# Patient Record
Sex: Female | Born: 1950 | Race: White | Hispanic: No | Marital: Married | Smoking: Never smoker
Health system: Southern US, Academic
[De-identification: ages and names within clinical notes are randomized; demographics above are authoritative.]

## PROBLEM LIST (undated history)

## (undated) DIAGNOSIS — R51 Headache: Secondary | ICD-10-CM

## (undated) DIAGNOSIS — E079 Disorder of thyroid, unspecified: Secondary | ICD-10-CM

## (undated) DIAGNOSIS — IMO0001 Reserved for inherently not codable concepts without codable children: Secondary | ICD-10-CM

## (undated) DIAGNOSIS — N3281 Overactive bladder: Secondary | ICD-10-CM

## (undated) DIAGNOSIS — Z6841 Body Mass Index (BMI) 40.0 and over, adult: Secondary | ICD-10-CM

## (undated) DIAGNOSIS — Z9889 Other specified postprocedural states: Secondary | ICD-10-CM

## (undated) DIAGNOSIS — R519 Headache, unspecified: Secondary | ICD-10-CM

## (undated) DIAGNOSIS — K449 Diaphragmatic hernia without obstruction or gangrene: Secondary | ICD-10-CM

## (undated) DIAGNOSIS — M1990 Unspecified osteoarthritis, unspecified site: Secondary | ICD-10-CM

## (undated) DIAGNOSIS — E039 Hypothyroidism, unspecified: Secondary | ICD-10-CM

## (undated) HISTORY — PX: HX KNEE SURGERY: 2100001320

## (undated) HISTORY — PX: HX KNEE REPLACMENT: SHX125

## (undated) HISTORY — PX: HX GASTRIC BYPASS: SHX52

## (undated) HISTORY — PX: HX GALL BLADDER SURGERY/CHOLE: SHX55

## (undated) HISTORY — DX: Overactive bladder: N32.81

## (undated) HISTORY — DX: Disorder of thyroid, unspecified: E07.9

## (undated) HISTORY — DX: Other specified postprocedural states: Z98.890

## (undated) HISTORY — DX: Body Mass Index (BMI) 40.0 and over, adult: Z684

## (undated) HISTORY — DX: Hypothyroidism, unspecified: E03.9

## (undated) HISTORY — DX: Morbid (severe) obesity due to excess calories: E66.01

## (undated) HISTORY — PX: TOTAL HIP ARTHROPLASTY: SHX124

## (undated) HISTORY — DX: Headache, unspecified: R51.9

## (undated) HISTORY — PX: KNEE CARTILAGE SURGERY: SHX688

## (undated) HISTORY — DX: Diaphragmatic hernia without obstruction or gangrene: K44.9

## (undated) HISTORY — DX: Unspecified osteoarthritis, unspecified site: M19.90

---

## 1997-10-05 ENCOUNTER — Ambulatory Visit
Admit: 1997-10-05 | Disposition: A | Discharge status: Home / Self Care | Payer: Self-pay | Admitting: Obstetrics & Gynecology

## 1997-10-20 ENCOUNTER — Ambulatory Visit
Admit: 1997-10-20 | Disposition: A | Discharge status: Home / Self Care | Payer: Self-pay | Admitting: Obstetrics & Gynecology

## 1999-05-08 ENCOUNTER — Ambulatory Visit: Admit: 1999-05-08 | Disposition: A | Discharge status: Home / Self Care | Payer: Self-pay | Source: Ambulatory Visit

## 1999-05-13 ENCOUNTER — Other Ambulatory Visit: Payer: Self-pay

## 1999-05-13 ENCOUNTER — Ambulatory Visit
Admit: 1999-05-13 | Disposition: A | Discharge status: Home / Self Care | Payer: Self-pay | Admitting: Orthopaedic Surgery

## 2001-02-09 ENCOUNTER — Ambulatory Visit
Admit: 2001-02-09 | Disposition: A | Discharge status: Home / Self Care | Payer: Self-pay | Admitting: Orthopaedic Surgery

## 2001-04-14 ENCOUNTER — Ambulatory Visit
Admission: RE | Admit: 2001-04-14 | Disposition: A | Discharge status: Home / Self Care | Payer: Self-pay | Source: Ambulatory Visit

## 2001-05-21 ENCOUNTER — Ambulatory Visit: Admit: 2001-05-21 | Disposition: A | Discharge status: Home / Self Care | Payer: Self-pay | Source: Ambulatory Visit

## 2001-05-24 ENCOUNTER — Ambulatory Visit: Payer: Self-pay

## 2003-05-17 ENCOUNTER — Ambulatory Visit: Payer: Self-pay

## 2003-06-15 ENCOUNTER — Ambulatory Visit: Payer: Self-pay

## 2003-06-29 ENCOUNTER — Ambulatory Visit
Admission: RE | Admit: 2003-06-29 | Disposition: A | Discharge status: Home / Self Care | Payer: Self-pay | Source: Ambulatory Visit

## 2003-07-27 ENCOUNTER — Ambulatory Visit: Payer: Self-pay

## 2004-04-02 ENCOUNTER — Ambulatory Visit: Payer: Self-pay

## 2005-01-21 ENCOUNTER — Ambulatory Visit
Admission: RE | Admit: 2005-01-21 | Disposition: A | Discharge status: Home / Self Care | Payer: Self-pay | Source: Ambulatory Visit | Admitting: Orthopaedic Surgery

## 2005-03-31 HISTORY — PX: LAPAROSCOPIC GASTRIC BANDING: SHX1100

## 2005-06-06 ENCOUNTER — Ambulatory Visit: Payer: Self-pay

## 2005-06-10 ENCOUNTER — Ambulatory Visit: Payer: Self-pay

## 2005-07-25 ENCOUNTER — Ambulatory Visit: Admit: 2005-07-25 | Disposition: A | Discharge status: Home / Self Care | Payer: Self-pay | Source: Ambulatory Visit

## 2005-09-18 ENCOUNTER — Ambulatory Visit
Admission: RE | Admit: 2005-09-18 | Disposition: A | Discharge status: Home / Self Care | Payer: Self-pay | Source: Ambulatory Visit

## 2005-10-24 ENCOUNTER — Ambulatory Visit: Payer: Self-pay

## 2006-01-08 ENCOUNTER — Ambulatory Visit: Payer: Self-pay

## 2006-01-13 ENCOUNTER — Ambulatory Visit: Payer: Self-pay

## 2006-03-31 HISTORY — PX: CHOLECYSTECTOMY: SHX55

## 2006-05-15 ENCOUNTER — Emergency Department: Payer: Self-pay

## 2006-05-19 ENCOUNTER — Ambulatory Visit: Payer: Self-pay

## 2006-05-22 ENCOUNTER — Ambulatory Visit: Payer: Self-pay

## 2006-07-15 ENCOUNTER — Ambulatory Visit: Payer: Self-pay

## 2006-12-19 ENCOUNTER — Ambulatory Visit: Payer: Self-pay

## 2007-11-30 ENCOUNTER — Ambulatory Visit: Payer: Self-pay

## 2009-03-07 ENCOUNTER — Ambulatory Visit: Admit: 2009-03-07 | Payer: Self-pay | Source: Ambulatory Visit

## 2009-04-20 ENCOUNTER — Ambulatory Visit: Admission: RE | Admit: 2009-04-20 | Payer: Self-pay | Source: Ambulatory Visit

## 2010-03-31 HISTORY — PX: JOINT REPLACEMENT: SHX530

## 2010-06-07 ENCOUNTER — Ambulatory Visit: Admit: 2010-06-07 | Disposition: A | Discharge status: Home / Self Care | Payer: Self-pay | Source: Ambulatory Visit

## 2010-08-06 ENCOUNTER — Encounter (INDEPENDENT_AMBULATORY_CARE_PROVIDER_SITE_OTHER): Payer: Self-pay | Admitting: Family Medicine

## 2010-08-06 ENCOUNTER — Ambulatory Visit (INDEPENDENT_AMBULATORY_CARE_PROVIDER_SITE_OTHER): Admitting: Family Medicine

## 2010-08-06 NOTE — Progress Notes (Signed)
 Brittany Bailey  Date of Service: 08/06/2010    Chief complaint: Chief Complaint   Patient presents with   . New Patient     to estab       Subjective  Pt comes to clinic today to establish care. Formerly seen by Dr. Cleotilde. Feels well today.   Pt is going to have left knee replacement in June 18th, 2012, her physician Dr. Jalaine would like for the pt to have preoperative blood work, EKG and evaluation one month prior to her proposed surgery.     ROS: +headache,   PMHx: Migraines, seasonal allergies  Surg Hx: cholecystectomy 2010, lab band 2008, right knee sx x 2  FamHx: Mother: T2DM   Social HX: never smoker, no etoh, no drug use, lives with husband  Allergies: PCN and Sulfa->hives     LMP: 1998  Last PAP: 2011 (due in September) never abnormal  Last Mammogram: 2012 (last month) never abnormal  GYN: Dr. Jenkins Bailey [place]  Orthopedic: Dr. Geralene Dasen    There is no problem list on file for this patient.      No outpatient prescriptions prior to encounter    Objective  Vitals: BP 124/80  Pulse 84  Temp(Src) 36.9 C (98.5 F) (Oral)  Resp 16  Ht 1.684 m (5' 6.3)  Wt 104.418 kg (230 lb 3.2 oz)  BMI 36.82 kg/m2  General: appears in good health, morbidly obese, appears younger than stated age and no distress  Eyes: Conjunctiva clear., Pupils equal and round, reactive to light and accomodation.   HENT:ENT without erythema or injection, mucous membranes moist., TM's Clear.   Neck: supple, symmetrical, trachea midline and no adenopathy  Lungs: clear to auscultation bilaterally.   Cardiovascular:    Heart regular rate and rhythm without murmer  Abdomen: soft, non-tender, bowel sounds normal and distended  Extremities: No edema and bilateral knees with crepitus, no effusions or erythema noted.   Skin: Skin warm and dry  Neurologic: normal mental status, sensory, motor, cranial nerves, reflexes, coordination, and gait  Psychiatric: AOx3 and normal affect, behavior, memory, thought content, judgement, and  speech.      Assessment/Plan  1. PE (physical exam), annual (V70.0Y) - discussed healthy diet and exercise, pt reports that she has been trying to eat low sugar, carb and low fat foods, she is active with [employer].    2. Preop examination (V72.84B) -preordered requested lab work, we will have the pt return to clinic for her evaluation and EKG. We will compile the requested preoperative testing and evaluation and fax to Dr. Jalaine when complete.      RTC for preoperative evaluation    Orders Placed This Encounter   . EAST ECG (AMB ONLY)   . CBC   . COMPREHENSIVE METABOLIC PROFILE - [facility] ONLY   . PROTHROMBIN TIME/INR SCREENING   . URINE ROUTINE W/REFLEX - [facility] ONLY   . TYPE AND SCREEN       Alm Shove Neville GAILS, MD

## 2010-08-22 ENCOUNTER — Telehealth (INDEPENDENT_AMBULATORY_CARE_PROVIDER_SITE_OTHER): Payer: Self-pay | Admitting: Family Medicine

## 2010-08-22 NOTE — Telephone Encounter (Signed)
Pt called and said that she did not get her script for synthroid that she used to get from Dr Hyacinth Meeker. Does she need to have an appointment for that or will you just call it in for her at [facility]. Her surgery date has been cancelled and will be rescheduled at a later date.

## 2010-08-24 MED ORDER — LEVOTHYROXINE 50 MCG TABLET
50.0000 ug | ORAL_TABLET | Freq: Every day | ORAL | Status: DC
Start: 2010-08-24 — End: 2013-03-30

## 2010-08-24 NOTE — Telephone Encounter (Signed)
[facility].  Lew Dawes, MD

## 2010-08-27 ENCOUNTER — Encounter (INDEPENDENT_AMBULATORY_CARE_PROVIDER_SITE_OTHER): Admitting: Family Medicine

## 2010-09-02 ENCOUNTER — Ambulatory Visit (INDEPENDENT_AMBULATORY_CARE_PROVIDER_SITE_OTHER): Admitting: UHP MOB PRIMARY CARE

## 2012-08-19 ENCOUNTER — Encounter (INDEPENDENT_AMBULATORY_CARE_PROVIDER_SITE_OTHER): Payer: Self-pay | Admitting: Family Medicine

## 2012-08-19 ENCOUNTER — Ambulatory Visit (INDEPENDENT_AMBULATORY_CARE_PROVIDER_SITE_OTHER): Admitting: Family Medicine

## 2012-08-19 VITALS — BP 130/82 | HR 84 | Temp 98.4°F | Resp 16 | Wt 285.7 lb

## 2012-08-19 MED ORDER — CEFDINIR 300 MG CAPSULE
300.00 mg | ORAL_CAPSULE | Freq: Two times a day (BID) | ORAL | Status: AC
Start: 2012-08-19 — End: 2012-08-29

## 2012-08-19 NOTE — Patient Instructions (Addendum)
[facility]  [address]  [place], [place] [address]  Phone: [phone]  Fax: [phone]             Open Daily 8:00am - 8:00pm, except Sundays 12pm-8pm         ~ Closed Thanksgiving and Christmas Day     Attending Caregiver: Talmage Nap, MD      Today's orders:   Orders Placed This Encounter   . cefdinir (OMNICEF) 300 mg Oral Capsule        Prescription(s) E-Rx to:  [id number] [place],  - [address]    ________________________________________________________________________  Short Term Disability and Family Medical Leave Act  [facility] does NOT provide assistance with any disability applications.  If you feel your medical condition requires you to be on disability, you will need to follow up with  Your primary care physician or a specialist.  We apologize for any inconvenience.    For Medication Prescribed by [facility]:  As an Urgent Care facility, our clinic does NOT offer prescription refills over the telephone.    If you need more of the medication one of our medical providers prescribed, you will  Either need to be re-evaluated by [place] or see your primary care physician.    ________________________________________________________________________      It is very important that we have a phone number that is the single best way to contact you in the event that we become aware of important clinical information or concerns after your discharge.  If the phone number you provided at registration is NOT this number you should inform staff and registration prior to leaving.       Your treatment and evaluation today was focused on identifying and treating potentially emergent conditions based on your presenting signs, symptoms, and history.  The resulting initial clinical impression and treatment plan is not intended to be definitive or a substitute for a full physical examination and evaluation by your primary care provider.  If your symptoms persist, worsen, or you develop any new or concerning symptoms, you need to be evaluated.      If you received x-rays during your visit, be aware that the final and formal interpretation of those films by a radiologist may occur after your discharge.  If there is a significant discrepancy identified after your discharge, we will contact you at the telephone number provided at registration.      If you received a pelvic exam, you may have cultures pending for sexually transmitted diseases.  Positive cultures are reported to the [facility] Department of Health as required by state law.  You should be contacted if you cultures are positive.  We will not contact you if they are negative.  You did NOT receive a PAP smear (the screening test for cervical).  This specific test for women is best performed by your gynecologist or primary care provider when indicated.      If you are over 60 year old, we cannot discuss your personal health information with a parent, spouse, family member, or anyone else without your express consent.  This does not include those who have legitimate access to your records and information to assist in your care under the provisions of HIPAA [facility] Portability and Accountability Act) law, or those to whom you have previously given express written consent to do so, such a legal guardian or Power of [place].  You may have received medication that may cause you to feel drowsy and/or light headed for several hours.  You may even experience some amnesia of your stay.  You should avoid operating a motor vehicle or performing any activity requiring complete alertness or coordination until you feel fully awake (approximately 24-48 hours).  Avoid alcoholic beverages.  You may also have a dry mouth for several hours.  This is a normal side effect and will disappear as the effects of the medication wear off.      Instructions discussed with patient upon discharge by clinical staff with all questions answered.  Please call [facility] [phone]) if any further questions.  Go immediately to the emergency department if any concern or worsening symptoms.      Talmage Nap, MD 08/19/2012, 10:06 AM      Sinusitis  Sinusitis is redness, soreness, and swelling (inflammation) of the paranasal sinuses. Paranasal sinuses are air pockets within the bones of your face (beneath the eyes, the middle of the forehead, or above the eyes). In healthy paranasal sinuses, mucus is able to drain out, and air is able to circulate through them by way of your nose. However, when your paranasal sinuses are inflamed, mucus and air can become trapped. This can allow bacteria and other germs to grow and cause infection.  Sinusitis can develop quickly and last only a short time (acute) or continue over a long period (chronic). Sinusitis that lasts for more than 12 weeks is considered chronic.   CAUSES   Causes of sinusitis include:   Allergies.   Structural abnormalities, such as displacement of the cartilage that separates your nostrils (deviated septum), which can decrease the air flow through your nose and sinuses and affect sinus drainage.   Functional abnormalities, such as when the small hairs (cilia) that line your sinuses and help remove mucus do not work properly or are not present.  SYMPTOMS    Symptoms of acute and chronic sinusitis are the same. The primary symptoms are pain and pressure around the affected sinuses. Other symptoms include:   Upper toothache.   Earache.   Headache.   Bad breath.   Decreased sense of smell and taste.   A cough, which worsens when you are lying flat.   Fatigue.   Fever.   Thick drainage from your nose, which often is green and may contain pus (purulent).   Swelling and warmth over the affected sinuses.  DIAGNOSIS   Your caregiver will perform a physical exam. During the exam, your caregiver may:   Look in your nose for signs of abnormal growths in your nostrils (nasal polyps).   Tap over the affected sinus to check for signs of infection.   View the inside of your sinuses (endoscopy) with a special imaging device with a light attached (endoscope), which is inserted into your sinuses.  If your caregiver suspects that you have chronic sinusitis, one or more of the following tests may be recommended:   Allergy tests.   Nasal culture A sample of mucus is taken from your nose and sent to a lab and screened for bacteria.   Nasal cytology A sample of mucus is taken from your nose and examined by your caregiver to determine if your sinusitis is related to an allergy.  TREATMENT   Most cases of acute sinusitis are related to a viral infection and will resolve on their own within 10 days. Sometimes medicines are prescribed to help relieve symptoms (pain medicine, decongestants,  nasal steroid sprays, or saline sprays).   However, for sinusitis related to a bacterial infection, your caregiver will prescribe antibiotic medicines. These are medicines that will help kill the bacteria causing the infection.   Rarely, sinusitis is caused by a fungal infection. In theses cases, your caregiver will prescribe antifungal medicine.   For some cases of chronic sinusitis, surgery is needed. Generally, these are cases in which sinusitis recurs more than 3 times per year, despite other treatments.  HOME CARE INSTRUCTIONS    Drink plenty of water. Water helps thin the mucus so your sinuses can drain more easily.   Use a humidifier.   Inhale steam 3 to 4 times a day (for example, sit in the bathroom with the shower running).   Apply a warm, moist washcloth to your face 3 to 4 times a day, or as directed by your caregiver.   Use saline nasal sprays to help moisten and clean your sinuses.   Take over-the-counter or prescription medicines for pain, discomfort, or fever only as directed by your caregiver.  SEEK IMMEDIATE MEDICAL CARE IF:   You have increasing pain or severe headaches.   You have nausea, vomiting, or drowsiness.   You have swelling around your face.   You have vision problems.   You have a stiff neck.   You have difficulty breathing.  MAKE SURE YOU:    Understand these instructions.   Will watch your condition.   Will get help right away if you are not doing well or get worse.  Document Released: 03/17/2005 Document Revised: 06/09/2011 Document Reviewed: 04/01/2011  [facility] Patient Information 2014 [place], [place].

## 2012-08-19 NOTE — Progress Notes (Signed)
 BP 130/82  Pulse 84  Temp(Src) 36.9 C (98.4 F) (Oral)  Resp 16  Wt 129.593 kg (285 lb 11.2 oz)  BMI 45.7 kg/m2  Deette Revak N Long, RN 08/19/2012, 9:13 AM

## 2012-08-19 NOTE — Progress Notes (Signed)
 SUBJECTIVE:   Brittany Bailey is a 62 y.o. female who complains of sinus pressure and congestion, sore throat, yellow nasal discharge with scant blood for 3 days. She denies a history of fevers, wheezing and shortness of breath and denies a history of asthma. Pt returned from [place] 3 days ago, she had been in [place] country for about 3 years. While she was there she had recurrent sinus infections.     ROS: denies fever, chills and sweats, denies headaches, no wheezing or shortness of breath, denies weakness or difficulty with ambulation, denies skin rash or lesion    SH: Patient denies smoking cigarettes.    Family History   Problem Relation Age of Onset   . Arthritis-rheumatoid Neg Hx    . Arthritis-osteo Neg Hx    . Asthma Neg Hx    . Breast Cancer Neg Hx      Past Medical History   Diagnosis Date   . Thyroid  disease      Patient Active Problem List   Diagnosis   . Hypothyroidism       Outpatient Prescriptions Prior to Visit:  Aspirin 81 mg Oral Tablet take 81 mg by mouth Once a day.   CALCIUM CARBONATE (CALCIUM 600 ORAL) take  by mouth.   Desloratadine  (CLARINEX ) 5 mg Oral Tablet take 5 mg by mouth Once a day.   ESOMEPRAZOLE MAGNESIUM (NEXIUM ORAL) take  by mouth.   levothyroxine  (SYNTHROID) 50 mcg Oral Tablet take 1 Tab by mouth Once a day.   Omega-3 Fatty Acids-Vitamin E (FISH OIL) 1,000 mg Oral Capsule take 1,000 mg by mouth Twice daily.   VITAMIN B COMPLEX NO.12-NIACIN ORAL take  by mouth.   terbinafine (LAMISIL) 250 mg Oral Tablet take 250 mg by mouth Once a day.     No facility-administered medications prior to visit.     OBJECTIVE:  BP 130/82  Pulse 84  Temp(Src) 36.9 C (98.4 F) (Oral)  Resp 16  Wt 129.593 kg (285 lb 11.2 oz)  BMI 45.7 kg/m2  She appears well, vital signs are as noted.   Ears: normal TM's and external ear canals AU.    Throat and pharynx nonerythematous   Neck supple. No adenopathy in the neck. Nose is congested with clear mucus. Maxillary sinuses non TTP   The chest is clear,  without wheezes or rales   Heart: regular rate and rhythm, no murmur   Skin: Warm and dry, no rash or lesion      ASSESSMENT:   sinusitis    PLAN:  Viral illness should run a benign course over 7-10 days. Discussed good handwashing, may use tylenol  for fever or pain Q4-6hrs. SNAP for Omnicef  if symptoms are not improving after a 7-10 day course of illness.  Pt is to call or return to clinic if she is having any rash or adverse reaction to the Omnicef , we discussed this concern at length. Pt verbalized understanding and agrees with the plan of care.   Symptomatic therapy suggested: push fluids and rest. Call or return to clinic prn if these symptoms worsen or fail to improve as anticipated.

## 2012-08-20 ENCOUNTER — Encounter (INDEPENDENT_AMBULATORY_CARE_PROVIDER_SITE_OTHER): Payer: Self-pay | Admitting: Family Medicine

## 2012-08-20 NOTE — Progress Notes (Signed)
Patient seen in urgent care yesterday. Called to follow up and patient picking up antibiotic today. Advised to call if further questions/concerns.

## 2012-08-22 ENCOUNTER — Ambulatory Visit (INDEPENDENT_AMBULATORY_CARE_PROVIDER_SITE_OTHER)

## 2012-08-22 VITALS — BP 138/85 | HR 72 | Temp 99.0°F | Resp 16 | Ht 66.0 in | Wt 285.0 lb

## 2012-08-22 MED ORDER — ONDANSETRON HCL 4 MG TABLET
4.00 mg | ORAL_TABLET | Freq: Two times a day (BID) | ORAL | Status: DC | PRN
Start: 2012-08-22 — End: 2013-03-30

## 2012-08-22 MED ORDER — HYDROCODONE 10 MG-CHLORPHENIRAMINE 8 MG/5 ML ORAL SUSP EXTEND.REL 12HR
5.00 mL | Freq: Two times a day (BID) | ORAL | Status: DC | PRN
Start: 2012-08-22 — End: 2013-03-03

## 2012-08-22 NOTE — Progress Notes (Signed)
ID: Brittany Bailey   DOB: 1950/08/11  Date of Service: 08/22/2012     Chief Complaint(s):   Chief Complaint   Patient presents with   . Cough     x 1 week   . Headache   . Ear Pain   . Nasal Congestion   . Runny Nose       Subjective:      On omnicef, cough and HA, ear pain, nasal congestion, runny nose.  Not improving.  Sick for 7 days now.  Returned from [place] about 10 days ago.    Seen on 5/22 and was advised it was most likely viral but given a snap a prescription for Omnicef. She started the medication the same day. Tolerating medication but she is not feeling any better. She states she a lot problems and [place] with recurrent sinus infections.  Current Outpatient Prescriptions   Medication Sig   . Aspirin 81 mg Oral Tablet take 81 mg by mouth Once a day.   Marland Kitchen CALCIUM CARBONATE (CALCIUM 600 ORAL) take  by mouth.   . cefdinir (OMNICEF) 300 mg Oral Capsule Take 1 Cap (300 mg total) by mouth Twice daily for 10 days   . chlorpheniramine-HYDROcodone (TUSSIONEX) 8-10 mg/5 mL Oral Suspension, Sust.Release 12 hr Take 5 mL by mouth Every 12 hours as needed for Cough   . Desloratadine (CLARINEX) 5 mg Oral Tablet take 5 mg by mouth Once a day.   . ESOMEPRAZOLE MAGNESIUM (NEXIUM ORAL) take  by mouth.   . levothyroxine (SYNTHROID) 50 mcg Oral Tablet take 1 Tab by mouth Once a day.   . Omega-3 Fatty Acids-Vitamin E (FISH OIL) 1,000 mg Oral Capsule take 1,000 mg by mouth Twice daily.   . ondansetron (ZOFRAN) 4 mg Oral Tablet Take 1 Tab (4 mg total) by mouth Every 12 hours as needed for nausea/vomiting   . VITAMIN B COMPLEX NO.12-NIACIN ORAL take  by mouth.       Allergies   Allergen Reactions   . Sulfa (Sulfonamides) Hives/ Urticaria   . Penicillins Hives/ Urticaria       Family History   Problem Relation Age of Onset   . Arthritis-rheumatoid Neg Hx    . Arthritis-osteo Neg Hx    . Asthma Neg Hx    . Breast Cancer Neg Hx        Active Ambulatory Problems     Diagnosis Date Noted   . Hypothyroidism 08/24/2010      Resolved Ambulatory Problems     Diagnosis Date Noted   . No Resolved Ambulatory Problems     Past Medical History   Diagnosis Date   . Thyroid disease        Social History        Objective:  Vitals Signs are stable   Filed Vitals:    08/22/12 1227   BP: 138/85   Pulse: 72   Temp: 37.2 C (99 F)   TempSrc: Oral   Resp: 16   Height: 1.676 m (5\' 6" )   Weight: 129.275 kg (285 lb)       Gen:  no acute distress, non toxic looking, interactive  HEENT:  TMs are normal, oropharynx is without exudate or erythema, the neck is supple with no adenopathy  Heart:  Regular rate and rhythm without murmur  Lungs:  Clear to auscultation bilaterally no wheeze  Skin:  No obvious rash    Assessment:  ICD-9-CM   1. Cough 786.2   2. Sinus pain 478.19       Orders Placed This Encounter   . ondansetron (ZOFRAN) 4 mg Oral Tablet   . chlorpheniramine-HYDROcodone (TUSSIONEX) 8-10 mg/5 mL Oral Suspension, Sust.Release 12 hr         Plan:   1)   Her symptoms are consistent with a viral illness that is coming in and currently through the community. She is on appropriate antibiotic coverage and she's only been taking it for 2 days, technically today is #3. I don't expect her to have improved today if she just started antibiotics. Continue with over-the-counter Clarinex and she is using multiple nasal sprays per the patient report including a steroid nasal spray. I think she is appropriate antibiotic coverage. It was stressed to her that with bowel illness her symptoms will not improve and was given enough time. Followup p.r.n. but it is 5-7 days to improve.     Follow up with [place] immediately if patient worsens or status changes significantly otherwise it should run benign course.     All questions were answered and patient agrees to plan  Level III

## 2012-08-22 NOTE — Progress Notes (Signed)
BP 138/85   Pulse 72   Temp(Src) 37.2 C (99 F) (Oral)   Resp 16   Ht 1.676 m (5\' 6" )   Wt 129.275 kg (285 lb)   BMI 46.02 kg/m2  Benny Lennert, RTR 08/22/2012, 12:29 PM

## 2012-08-23 ENCOUNTER — Encounter (INDEPENDENT_AMBULATORY_CARE_PROVIDER_SITE_OTHER): Payer: Self-pay

## 2012-08-23 NOTE — Progress Notes (Unsigned)
Pt seen in urgent care yesterday. Called today to follow up, pt's husband states Brittany Bailey is not feeling much better. Husband states that [name] slept most of the day yesterday, but still has a headache. No questions or concerns at this time. Informed pt's husband to call back if there are any questions, concerns or lack of improvement over the next day or two.  Marcy Siren, RTR 08/23/2012, 9:28 AM

## 2013-02-21 ENCOUNTER — Other Ambulatory Visit: Payer: Self-pay

## 2013-02-22 ENCOUNTER — Ambulatory Visit
Admission: RE | Admit: 2013-02-22 | Discharge: 2013-02-22 | Disposition: A | Discharge status: Home / Self Care | Source: Ambulatory Visit

## 2013-02-22 DIAGNOSIS — R1310 Dysphagia, unspecified: Secondary | ICD-10-CM | POA: Insufficient documentation

## 2013-02-22 MED ORDER — BARIUM SULFATE 98 % ORAL POWDER FOR SUSPENSION
INHALATION_SUSPENSION | ORAL | Status: AC
Start: 2013-02-22 — End: 2013-02-22
  Filled 2013-02-22: qty 135

## 2013-02-22 MED ORDER — BARIUM SULFATE 96 % (W/W) ORAL POWDER FOR SUSPENSION
INHALATION_SUSPENSION | ORAL | Status: AC
Start: 2013-02-22 — End: 2013-02-22
  Filled 2013-02-22: qty 1

## 2013-02-22 MED ADMIN — barium sulfate 96 % (w/w) oral powder for suspension: 176 g | ORAL | NDC 32909075003

## 2013-02-22 MED ADMIN — sod bicarb-citric ac-simeth 2.21 gram-1.53 gram/4 gram granules efferv: 1 | ORAL | NDC 10361079301

## 2013-02-22 MED ADMIN — barium sulfate 98 % oral powder for suspension: 135 mL | ORAL | NDC 32909076401

## 2013-03-03 ENCOUNTER — Ambulatory Visit (INDEPENDENT_AMBULATORY_CARE_PROVIDER_SITE_OTHER)

## 2013-03-03 ENCOUNTER — Encounter (INDEPENDENT_AMBULATORY_CARE_PROVIDER_SITE_OTHER): Payer: Self-pay

## 2013-03-03 VITALS — BP 152/94 | HR 80 | Temp 98.4°F | Ht 67.5 in | Wt 314.0 lb

## 2013-03-03 MED ORDER — DOXYCYCLINE HYCLATE 100 MG CAPSULE
100.00 mg | ORAL_CAPSULE | Freq: Two times a day (BID) | ORAL | Status: AC
Start: 2013-03-03 — End: 2013-03-13

## 2013-03-03 NOTE — Patient Instructions (Signed)
 Sinusitis  Sinusitis is redness, soreness, and swelling (inflammation) of the paranasal sinuses. Paranasal sinuses are air pockets within the bones of your face (beneath the eyes, the middle of the forehead, or above the eyes). In healthy paranasal sinuses, mucus is able to drain out, and air is able to circulate through them by way of your nose. However, when your paranasal sinuses are inflamed, mucus and air can become trapped. This can allow bacteria and other germs to grow and cause infection.  Sinusitis can develop quickly and last only a short time (acute) or continue over a long period (chronic). Sinusitis that lasts for more than 12 weeks is considered chronic.   CAUSES   Causes of sinusitis include:   Allergies.   Structural abnormalities, such as displacement of the cartilage that separates your nostrils (deviated septum), which can decrease the air flow through your nose and sinuses and affect sinus drainage.   Functional abnormalities, such as when the small hairs (cilia) that line your sinuses and help remove mucus do not work properly or are not present.  SYMPTOMS   Symptoms of acute and chronic sinusitis are the same. The primary symptoms are pain and pressure around the affected sinuses. Other symptoms include:   Upper toothache.   Earache.   Headache.   Bad breath.   Decreased sense of smell and taste.   A cough, which worsens when you are lying flat.   Fatigue.   Fever.   Thick drainage from your nose, which often is green and may contain pus (purulent).   Swelling and warmth over the affected sinuses.  DIAGNOSIS   Your caregiver will perform a physical exam. During the exam, your caregiver may:   Look in your nose for signs of abnormal growths in your nostrils (nasal polyps).   Tap over the affected sinus to check for signs of infection.   View the inside of your sinuses (endoscopy) with a special imaging device with a light attached (endoscope), which is inserted into your  sinuses.  If your caregiver suspects that you have chronic sinusitis, one or more of the following tests may be recommended:   Allergy tests.   Nasal culture A sample of mucus is taken from your nose and sent to a lab and screened for bacteria.   Nasal cytology A sample of mucus is taken from your nose and examined by your caregiver to determine if your sinusitis is related to an allergy.  TREATMENT   Most cases of acute sinusitis are related to a viral infection and will resolve on their own within 10 days. Sometimes medicines are prescribed to help relieve symptoms (pain medicine, decongestants, nasal steroid sprays, or saline sprays).   However, for sinusitis related to a bacterial infection, your caregiver will prescribe antibiotic medicines. These are medicines that will help kill the bacteria causing the infection.   Rarely, sinusitis is caused by a fungal infection. In theses cases, your caregiver will prescribe antifungal medicine.  For some cases of chronic sinusitis, surgery is needed. Generally, these are cases in which sinusitis recurs more than 3 times per year, despite other treatments.  HOME CARE INSTRUCTIONS    Drink plenty of water. Water helps thin the mucus so your sinuses can drain more easily.   Use a humidifier.   Inhale steam 3 to 4 times a day (for example, sit in the bathroom with the shower running).   Apply a warm, moist washcloth to your face 3 to 4 times a day,  or as directed by your caregiver.   Use saline nasal sprays to help moisten and clean your sinuses.   Take over-the-counter or prescription medicines for pain, discomfort, or fever only as directed by your caregiver.  SEEK IMMEDIATE MEDICAL CARE IF:   You have increasing pain or severe headaches.   You have nausea, vomiting, or drowsiness.   You have swelling around your face.   You have vision problems.   You have a stiff neck.   You have difficulty breathing.  MAKE SURE YOU:    Understand these  instructions.   Will watch your condition.   Will get help right away if you are not doing well or get worse.  Document Released: 03/17/2005 Document Revised: 06/09/2011 Document Reviewed: 04/01/2011  ExitCare Patient Information 2014 [employer] MEIG[employer]    [facility]  [address], [address]  [place], [place] [address]  Phone: [phone]  Fax: [phone]               Open Daily 12:00pm - 8:00pm ~ Closed Thanksgiving and Christmas Day     Attending Caregiver: Willma JONELLE Sora, MD      Today's orders:   Orders Placed This Encounter   . doxycycline  hyclate (VIBRAMYCIN ) 100 mg Oral Capsule        If you have been given and antibiotic, we recommend eating yogurt or taking PROBIOTIC pills to decrease the risk of antibiotic induced diarrhea and a resulting infection called C Difficile which can be dangerous/deadly.          Prescription(s) E-Rx to:  [id number] - [place], [place] - [address]    ________________________________________________________________________  Short Term Disability and Family Medical Leave Act  [facility] does NOT provide assistance with any disability applications.  If you feel your medical condition requires you to be on disability, you will need to follow up with  Your primary care physician or a specialist.  We apologize for any inconvenience.    For Medication Prescribed by [facility]:  As an Urgent Care facility, our clinic does NOT offer prescription refills over the telephone.    If you need more of the medication one of our medical providers prescribed, you will  Either need to be re-evaluated by us  or see your primary care physician.    ________________________________________________________________________      It is very important that we have a phone number that is the single best way to contact you in the event that we become aware of important clinical information or concerns after your discharge.  If the phone number you provided at  registration is NOT this number you should inform staff and registration prior to leaving.      Your treatment and evaluation today was focused on identifying and treating potentially emergent conditions based on your presenting signs, symptoms, and history.  The resulting initial clinical impression and treatment plan is not intended to be definitive or a substitute for a full physical examination and evaluation by your primary care provider.  If your symptoms persist, worsen, or you develop any new or concerning symptoms, you need to be evaluated.      If you received x-rays during your visit, be aware that the final and formal interpretation of those films by a radiologist may occur after your discharge.  If there is a significant discrepancy identified after your discharge, we will contact you at the telephone number provided at registration.  If you received a pelvic exam, you may have cultures pending for sexually transmitted diseases.  Positive cultures are reported to the [facility] Department of Health as required by state law.  You should be contacted if you cultures are positive.  We will not contact you if they are negative.  You did NOT receive a PAP smear (the screening test for cervical).  This specific test for women is best performed by your gynecologist or primary care provider when indicated.      If you are over 14 year old, we cannot discuss your personal health information with a parent, spouse, family member, or anyone else without your express consent.  This does not include those who have legitimate access to your records and information to assist in your care under the provisions of HIPAA [facility] Portability and Accountability Act) law, or those to whom you have previously given express written consent to do so, such a legal guardian or Power of [place].      You may have received medication that may cause you to feel drowsy and/or light headed for several hours.  You may even  experience some amnesia of your stay.  You should avoid operating a motor vehicle or performing any activity requiring complete alertness or coordination until you feel fully awake (approximately 24-48 hours).  Avoid alcoholic beverages.  You may also have a dry mouth for several hours.  This is a normal side effect and will disappear as the effects of the medication wear off.      Instructions discussed with patient upon discharge by clinical staff with all questions answered.  Please call [facility] [phone]) if any further questions.  Go immediately to the emergency department if any concern or worsening symptoms.      Willma JONELLE Sora, MD 03/03/2013, 12:04 PM

## 2013-03-03 NOTE — Progress Notes (Signed)
ID: Leanna Sato   DOB: 02-02-1951  Date of Service: 03/03/2013     Chief Complaint(s):   Chief Complaint   Patient presents with    Nasal Congestion    Runny Nose    Sinus Drainage       Subjective:     Headache for the past 3-4 days. Bilateral ear popping and today she feels dizzy. She's also had a lot of postnasal drip/sinus drainage. She states she tends to get these regularly and this works I get no bad sinus infections". Allergic to sulfa and penicillin. Tolerating p.o. States that she has Flonase at home but has not been using it. I have recommended that she start using medication to help open up her sinuses and continues to drain better.     Current Outpatient Prescriptions   Medication Sig    Aspirin 81 mg Oral Tablet take 81 mg by mouth Once a day.    CALCIUM CARBONATE (CALCIUM 600 ORAL) take  by mouth.    Desloratadine (CLARINEX) 5 mg Oral Tablet take 5 mg by mouth Once a day.    doxycycline hyclate (VIBRAMYCIN) 100 mg Oral Capsule Take 1 Cap (100 mg total) by mouth Twice daily for 10 days    ESOMEPRAZOLE MAGNESIUM (NEXIUM ORAL) take  by mouth.    levothyroxine (SYNTHROID) 50 mcg Oral Tablet take 1 Tab by mouth Once a day.    Omega-3 Fatty Acids-Vitamin E (FISH OIL) 1,000 mg Oral Capsule take 1,000 mg by mouth Twice daily.    ondansetron (ZOFRAN) 4 mg Oral Tablet Take 1 Tab (4 mg total) by mouth Every 12 hours as needed for nausea/vomiting    valACYclovir (VALTREX) 500 mg Oral Tablet Take 500 mg by mouth Twice daily    VITAMIN B COMPLEX NO.12-NIACIN ORAL take  by mouth.       Allergies   Allergen Reactions    Sulfa (Sulfonamides) Hives/ Urticaria    Penicillins Hives/ Urticaria       Family History   Problem Relation Age of Onset    Arthritis-rheumatoid Neg Hx     Arthritis-osteo Neg Hx     Asthma Neg Hx     Breast Cancer Neg Hx        Active Ambulatory Problems     Diagnosis Date Noted    Hypothyroidism 08/24/2010     Resolved Ambulatory Problems     Diagnosis Date Noted    No  Resolved Ambulatory Problems     Past Medical History   Diagnosis Date    Thyroid disease     Overactive bladder        Social History        Objective:  Vitals Signs are stable   Filed Vitals:    03/03/13 1116   BP: 152/94   Pulse: 80   Temp: 36.9 C (98.4 F)   TempSrc: Oral   Height: 1.715 m (5' 7.5")   Weight: 142.429 kg (314 lb)       Gen:  no acute distress, non toxic looking, interactive  HEENT:  TMs are normal, oropharynx is without exudate or erythema, the neck is supple with no adenopathy, mild Maxillary sinus tenderness  Heart:  Regular rate and rhythm without murmur  Lungs:  Clear to auscultation bilaterally no wheeze  Skin:  No obvious rash    Assessment:            ICD-9-CM   1. Ear pain 388.70   2. Head congestion 478.19  3. Sinus pressure 478.19   4. Post-nasal drip 784.91       Orders Placed This Encounter    doxycycline hyclate (VIBRAMYCIN) 100 mg Oral Capsule         Plan:   1) Patient given Flonase to help open up the head and aid with proper drainage. Zyrtec prescribed to help with drying up of body secretions.  Use for approx 10 days and then dc.  Given with warnings.  Explained to patient that this could be a viral illness.    Antibiotics as above, with warnings including allergic reaction, and C. Difficile, both of which can be severe. Patient advised these type of reactions are bad luck and unpredictable.  Yogurt/probiotics prophylaxis discussed.    At this time I recommend symptomatic care, and Tylenol Motrin, push p.o. fluids, not to worry if appetite drops off as long as taking good fluids.     Follow up with [place] immediately if patient worsens or status changes significantly otherwise it should run benign course.     All questions were answered and patient agrees to plan  Level III

## 2013-03-03 NOTE — Progress Notes (Signed)
BP 152/94   Pulse 80   Temp(Src) 36.9 C (98.4 F) (Oral)   Ht 1.715 m (5' 7.5")   Wt 142.429 kg (314 lb)   BMI 48.43 kg/m2  Wendee Beavers, RTR 03/03/2013, 11:21 AM

## 2013-03-04 ENCOUNTER — Encounter (INDEPENDENT_AMBULATORY_CARE_PROVIDER_SITE_OTHER): Payer: Self-pay

## 2013-03-04 NOTE — Progress Notes (Signed)
Patient seen at urgent care yesterday. Left message to call if questions or concerns.

## 2013-03-30 ENCOUNTER — Other Ambulatory Visit (HOSPITAL_BASED_OUTPATIENT_CLINIC_OR_DEPARTMENT_OTHER)
Admission: RE | Admit: 2013-03-30 | Discharge: 2013-03-30 | Disposition: A | Discharge status: Home / Self Care | Attending: FAMILY MEDICINE | Admitting: FAMILY MEDICINE

## 2013-03-30 ENCOUNTER — Encounter (INDEPENDENT_AMBULATORY_CARE_PROVIDER_SITE_OTHER): Payer: Self-pay | Admitting: FAMILY MEDICINE

## 2013-03-30 ENCOUNTER — Ambulatory Visit (INDEPENDENT_AMBULATORY_CARE_PROVIDER_SITE_OTHER): Admitting: FAMILY MEDICINE

## 2013-03-30 VITALS — BP 140/90 | HR 81 | Temp 97.5°F | Resp 16 | Ht 67.5 in | Wt 315.8 lb

## 2013-03-30 DIAGNOSIS — R82998 Other abnormal findings in urine: Secondary | ICD-10-CM

## 2013-03-30 DIAGNOSIS — Z139 Encounter for screening, unspecified: Secondary | ICD-10-CM

## 2013-03-30 DIAGNOSIS — Z9884 Bariatric surgery status: Secondary | ICD-10-CM

## 2013-03-30 DIAGNOSIS — E039 Hypothyroidism, unspecified: Secondary | ICD-10-CM

## 2013-03-30 DIAGNOSIS — J329 Chronic sinusitis, unspecified: Secondary | ICD-10-CM

## 2013-03-30 DIAGNOSIS — G43909 Migraine, unspecified, not intractable, without status migrainosus: Secondary | ICD-10-CM

## 2013-03-30 DIAGNOSIS — R51 Headache: Secondary | ICD-10-CM

## 2013-03-30 DIAGNOSIS — Z23 Encounter for immunization: Secondary | ICD-10-CM

## 2013-03-30 DIAGNOSIS — R102 Pelvic and perineal pain: Secondary | ICD-10-CM

## 2013-03-30 DIAGNOSIS — R519 Headache, unspecified: Secondary | ICD-10-CM

## 2013-03-30 DIAGNOSIS — R110 Nausea: Secondary | ICD-10-CM

## 2013-03-30 DIAGNOSIS — R109 Unspecified abdominal pain: Secondary | ICD-10-CM

## 2013-03-30 DIAGNOSIS — R8290 Unspecified abnormal findings in urine: Secondary | ICD-10-CM

## 2013-03-30 LAB — URINALYSIS (ROUTINE) - BMC ONLY
BILIRUBIN,URINE: NEGATIVE mg/dL
KETONES,URINE: NEGATIVE mg/dL
LEUKOCYTE ESTERASE,URINE: NEGATIVE LEU/uL
NITRITES,URINE: NEGATIVE
PH,URINE: 5 (ref <8.0)
PROTEIN, URINE, RANDOM: NEGATIVE mg/dL
SPECIFIC GRAVITY,URINE: 1.024 — AB (ref <1.022)
UROBILINOGEN, URINE: <2 mg/dL (ref <2.0)

## 2013-03-30 LAB — BASIC METABOLIC PROFILE - BMC/JMC ONLY
BUN: 17 mg/dL (ref 6–22)
CALCIUM: 9.9 mg/dL (ref 8.5–10.5)
CARBON DIOXIDE: 28 mmol/L (ref 22–32)
CHLORIDE: 103 mmol/L (ref 101–111)
CREATININE: 0.79 mg/dL (ref 0.53–1.00)
ESTIMATED GLOMERULAR FILTRATION RATE: >60 mL/min (ref >60)
GLUCOSE: 93 mg/dL (ref 70–110)
POTASSIUM: 4.2 mmol/L (ref 3.5–5.0)
SODIUM: 138 mmol/L (ref 136–145)

## 2013-03-30 LAB — LIPID PANEL
CHOL/HDL RATIO: 4.7
CHOLESTEROL: 289 mg/dL — ABNORMAL HIGH (ref 120–199)
HDL-CHOLESTEROL: 62 mg/dL (ref >39)
LDL (CALCULATED): 189 mg/dL — ABNORMAL HIGH (ref <130)
TRIGLYCERIDES: 192 mg/dL — ABNORMAL HIGH (ref <150)
VLDL (CALCULATED): 38 mg/dL — ABNORMAL HIGH (ref 5–35)

## 2013-03-30 LAB — CBC
BASOPHIL #: 0.08 10*3/uL (ref 0.00–0.10)
BASOPHILS %: 1.3 % (ref 0.0–1.4)
EOSINOPHIL #: 0.31 K/uL (ref 0.00–0.50)
EOSINOPHIL %: 4.8 % (ref 0.0–5.2)
HCT: 46.7 % — ABNORMAL HIGH (ref 36.0–45.0)
HGB: 15.5 g/dL (ref 12.0–15.5)
LYMPHOCYTE #: 1.97 K/uL (ref 0.70–3.20)
MCH: 29.6 pg (ref 28.0–34.0)
MCHC: 33.2 g/dL (ref 33.0–37.0)
MCV: 89.2 fL (ref 82.0–97.0)
MONOCYTE #: 0.59 10*3/uL (ref 0.20–0.90)
MONOCYTE %: 9.2 % (ref 4.8–12.0)
MPV: 8.2 fL (ref 7.0–9.4)
PLATELET COUNT: 238 10*3/uL (ref 150–400)
PMN #: 3.43 10*3/uL (ref 1.50–6.50)
RBC: 5.24 M/uL — ABNORMAL HIGH (ref 4.00–5.10)
RDW: 12.1 % (ref 11.0–13.0)
WBC: 6.4 K/uL (ref 4.0–11.0)

## 2013-03-30 LAB — TSH CASCADE,3RD GEN WITH REFLEX TO FT4: TSH CASCADE: 2.845 u[IU]/mL (ref 0.340–5.600)

## 2013-03-30 LAB — VITAMIN B12: VITAMIN B12: >1500 pg/mL — ABNORMAL HIGH (ref 180–914)

## 2013-03-30 MED ORDER — ZOSTER VACCINE LIVE (PF) 19,400 UNIT/0.65 ML SUBCUTANEOUS SUSPENSION
0.6500 mL | INHALATION_SUSPENSION | Freq: Once | SUBCUTANEOUS | Status: AC
Start: 2013-03-30 — End: 2013-03-30

## 2013-03-30 MED ORDER — CEFDINIR 300 MG CAPSULE
300.00 mg | ORAL_CAPSULE | Freq: Two times a day (BID) | ORAL | Status: AC
Start: 2013-03-30 — End: 2013-04-06

## 2013-03-30 MED ORDER — ONDANSETRON HCL 4 MG TABLET
4.0000 mg | ORAL_TABLET | Freq: Two times a day (BID) | ORAL | Status: DC | PRN
Start: 2013-03-30 — End: 2015-05-23

## 2013-03-30 MED ORDER — AMITRIPTYLINE 50 MG TABLET
50.00 mg | ORAL_TABLET | Freq: Every evening | ORAL | Status: DC
Start: 2013-03-30 — End: 2013-09-26

## 2013-03-30 MED ORDER — SUMATRIPTAN 50 MG TABLET
50.00 mg | ORAL_TABLET | Freq: Once | ORAL | Status: DC | PRN
Start: 2013-03-30 — End: 2014-01-09

## 2013-03-30 NOTE — Progress Notes (Signed)
BP 140/90  Pulse 81  Temp(Src) 36.4 C (97.5 F) (Oral)  Resp 16  Ht 1.715 m (5' 7.5")  Wt 143.246 kg (315 lb 12.8 oz)  BMI 48.7 kg/m2  Pt tolerated venipuncture to RAC without complication. KM

## 2013-03-30 NOTE — Progress Notes (Signed)
03/30/13 1400   Urine   Time collected 1434   Glucose Negative   Bilirubin Negative   Ketones Negative   Specific Gravity 1.030   Blood (urine) Small (1+)   pH 5.0   Protein Negative   Urobilinogen Normal    Nitrite Negative   Leukocytes Negative   Initials km

## 2013-04-07 ENCOUNTER — Telehealth (INDEPENDENT_AMBULATORY_CARE_PROVIDER_SITE_OTHER): Payer: Self-pay | Admitting: FAMILY MEDICINE

## 2013-04-07 NOTE — Telephone Encounter (Signed)
Pt aware, made appt for March 9. KM

## 2013-04-07 NOTE — Telephone Encounter (Signed)
Patient called stating that she just received a phone call from Dr. Jennye Boroughssman's office about her lab results. That was her previous doctor's office. She would like for someone from this office to get back to her about the results.

## 2013-04-07 NOTE — Telephone Encounter (Signed)
Is it ok to release results to pt?

## 2013-04-07 NOTE — Telephone Encounter (Signed)
B12 is a little high so she should switch to every other day for this supplement.  Her cholesterol is high so we will need to talk about possibly starting medication at her next visit.  She also had a tiny bit of blood in her urine but otherwise it looked good without signs of infection.  We need to recheck a urine sample in 6 weeks to make sure the blood is gone.  Everything else looked good.  She should probably make the follow-up appointment in 1-2 months regarding her cholesterol.

## 2013-04-09 NOTE — Progress Notes (Signed)
[facility]  New Patient H&P    CC:  Chief Complaint   Patient presents with    Establish Care       HPI:  Brittany Bailey is a 63 y.o. female here to establish care.  She has a couple of concerns today.      1. Thinks she has a sinus infection: sick with cold symptoms for almost 2 weeks, not getting better.  Also malaise.    2. Headaches: has history of migraines very rarely.  Gets photophobia, nausea and sometimes vomiting.  Lately they've been happening about 2-3 times a month and she has to miss work kfrom them.  Wants to try prophylaxis.    3. Hypothyroidism: history of this, due for a recheck    4. Has an overactive bladder, sometimes gets utis.  Having some suprapubic pressure, wonders if she has one now        PMH:  Past Medical History   Diagnosis Date    Thyroid disease     Overactive bladder      Outpatient Prescriptions Marked as Taking for the 03/30/13 encounter (Office Visit) with Burna CashBurke, Tamaka Sawin, MD   Medication Sig    amitriptyline (ELAVIL) 50 mg Oral Tablet Take 1 Tab (50 mg total) by mouth Every night    Aspirin 81 mg Oral Tablet take 81 mg by mouth Once a day.    CALCIUM CARBONATE (CALCIUM 600 ORAL) take  by mouth.    [EXPIRED] cefdinir (OMNICEF) 300 mg Oral Capsule Take 1 Cap (300 mg total) by mouth Twice daily for 7 days    Desloratadine (CLARINEX) 5 mg Oral Tablet take 5 mg by mouth Once a day.    ESOMEPRAZOLE MAGNESIUM (NEXIUM ORAL) take  by mouth.    levothyroxine (SYNTHROID) 75 mcg Oral Tablet Take 75 mcg by mouth Once a day    Omega-3 Fatty Acids-Vitamin E (FISH OIL) 1,000 mg Oral Capsule take 1,000 mg by mouth Twice daily.    ondansetron (ZOFRAN) 4 mg Oral Tablet Take 1 Tab (4 mg total) by mouth Every 12 hours as needed for nausea/vomiting    SUMAtriptan (IMITREX) 50 mg Oral Tablet Take 1 Tab (50 mg total) by mouth Once, as needed for Migraine for up to 1 dose May repeat in 2 hours in needed    VITAMIN B COMPLEX NO.12-NIACIN ORAL take  by mouth.    [EXPIRED] Zoster  Vaccine Live, PF, (ZOSTAVAX, PF,) 19,400 unit/0.65 mL Subcutaneous Suspension for Reconstitution 0.65 mL by Subcutaneous route One time for 1 dose       PSH:  Past Surgical History   Procedure Laterality Date    Hx knee surgery      Hx gall bladder surgery/chole      Hx gastric bypass         Social History:  History     Social History    Marital Status: Married     Spouse Name: N/A     Number of Children: N/A    Years of Education: N/A     Occupational History    Not on file.     Social History Main Topics    Smoking status: Never Smoker     Smokeless tobacco: Not on file    Alcohol Use: No    Drug Use: No    Sexual Activity: Yes     Partners: Male     Other Topics Concern    Not on file     Social  History Narrative    No narrative on file     Health Maintenance   Topic Date Due    Hepatitis C Screening Ab  03/20/1971    Pap Smear  03/19/1974    Mammography  03/20/2003    Colonoscopy  03/20/2003    Influenza Vaccine  11/29/2012    Zoster Vaccine  03/19/2013       Family History:  Family History   Problem Relation Age of Onset    Arthritis-rheumatoid Neg Hx     Arthritis-osteo Neg Hx     Asthma Neg Hx     Breast Cancer Neg Hx     Thyroid Disease Mother     Hypertension Mother     Hypertension Father     Heart Attack Father     Thyroid Disease Sister     Heart Attack Brother     Hypertension Brother     No Known Problems Maternal Grandmother     No Known Problems Maternal Grandfather     No Known Problems Paternal Grandmother     No Known Problems Paternal Grandfather     No Known Problems Son     Thyroid Disease Sister     Hypertension Sister     No Known Problems Sister     Hypertension Brother     Hypertension Brother        ROS:  All others normal    Physical Exam:  Vitals: Blood pressure 140/90, pulse 81, temperature 36.4 C (97.5 F), temperature source Oral, resp. rate 16, height 1.715 m (5' 7.5"), weight 143.246 kg (315 lb 12.8 oz).  General: appears in good health and  appears older than stated age  HENT:TM's Clear. , Nose with erythema. , Pharynx injected.   Lungs: clear to auscultation bilaterally.  Cardiovascular:    Heart regular rate and rhythm  Abdomen: soft, non-tender  Extremities: no cyanosis or edema  Skin: Skin warm and dry    Assessment/Plan:    ICD-9-CM    1. Suprapubic pressure 789.09 POCT URINE DIPSTICK   2. Need for influenza vaccination V04.81    3. Need for pneumococcal vaccination V03.82 PNEUMOCOCCAL POLYVALENT VACCINE(PNEUMOVAX)(ADMIN)   4. Personal history of gastric banding V45.86 VITAMIN B12     CBC   5. Screening V82.9 LIPID PANEL     BASIC METABOLIC PROFILE - [facility] ONLY   6. Hypothyroid 244.9 TSH CASCADE,3RD GEN WITH REFLEX TO FT4 - [facility] ONLY   7. Chronic headaches 784.0    8. Migraines 346.90 amitriptyline (ELAVIL) 50 mg Oral Tablet     SUMAtriptan (IMITREX) 50 mg Oral Tablet   9. Nausea 787.02 ondansetron (ZOFRAN) 4 mg Oral Tablet   10. Sinusitis 473.9 cefdinir (OMNICEF) 300 mg Oral Capsule   11. Need for zoster vaccination V04.89 Zoster Vaccine Live, PF, (ZOSTAVAX, PF,) 19,400 unit/0.65 mL Subcutaneous Suspension for Reconstitution   12. Abnormal urine 791.9 URINALYSIS - [facility] ONLY           Return in 1 month    Burna Cash, MD 04/09/2013, 4:18 PM

## 2013-05-09 ENCOUNTER — Telehealth (INDEPENDENT_AMBULATORY_CARE_PROVIDER_SITE_OTHER): Payer: Self-pay | Admitting: FAMILY MEDICINE

## 2013-05-09 NOTE — Telephone Encounter (Signed)
Pt is wanting to know if you would write her a medical clearance letter stating she is able to participate in the, arthritis water aerobics class, at her gym?

## 2013-05-17 ENCOUNTER — Encounter (INDEPENDENT_AMBULATORY_CARE_PROVIDER_SITE_OTHER): Payer: Self-pay | Admitting: FAMILY MEDICINE

## 2013-05-17 ENCOUNTER — Other Ambulatory Visit (HOSPITAL_BASED_OUTPATIENT_CLINIC_OR_DEPARTMENT_OTHER)
Admission: RE | Admit: 2013-05-17 | Discharge: 2013-05-17 | Disposition: A | Discharge status: Home / Self Care | Attending: FAMILY MEDICINE | Admitting: FAMILY MEDICINE

## 2013-05-17 ENCOUNTER — Ambulatory Visit (INDEPENDENT_AMBULATORY_CARE_PROVIDER_SITE_OTHER): Admitting: FAMILY MEDICINE

## 2013-05-17 VITALS — HR 97 | Temp 98.4°F | Resp 18 | Wt 319.1 lb

## 2013-05-17 DIAGNOSIS — R319 Hematuria, unspecified: Secondary | ICD-10-CM | POA: Insufficient documentation

## 2013-05-17 DIAGNOSIS — M545 Low back pain, unspecified: Secondary | ICD-10-CM

## 2013-05-17 DIAGNOSIS — R102 Pelvic and perineal pain: Secondary | ICD-10-CM

## 2013-05-17 DIAGNOSIS — R109 Unspecified abdominal pain: Secondary | ICD-10-CM

## 2013-05-17 DIAGNOSIS — N390 Urinary tract infection, site not specified: Secondary | ICD-10-CM

## 2013-05-17 MED ORDER — NITROFURANTOIN MONOHYDRATE/MACROCRYSTALS 100 MG CAPSULE
100.00 mg | ORAL_CAPSULE | Freq: Two times a day (BID) | ORAL | Status: AC
Start: 2013-05-17 — End: 2013-05-24

## 2013-05-17 NOTE — Progress Notes (Signed)
Pulse 97  Temp(Src) 36.9 C (98.4 F) (Oral)  Resp 18  Wt 144.743 kg (319 lb 1.6 oz)  BMI 49.21 kg/m2  SpO2 97%

## 2013-05-17 NOTE — Progress Notes (Signed)
05/17/13 1500   Urine   Time collected 1512   Glucose Negative   Bilirubin Negative   Ketones Negative   Specific Gravity 1.030   Blood (urine) Moderate (2+)   pH 5.0   Protein Negative   Urobilinogen Normal    Nitrite Negative   Leukocytes Negative   Initials slq

## 2013-05-19 LAB — URINE CULTURE
AMPICILLIN/SULBACTAM: 16 — AB
AMPICILLIN: >=32 — AB
AZTREONAM: <=1 — AB
CEFAZOLIN: <=4 — AB
CEFEPIME: <=1 — AB
CEFTRIAXONE: <=1 — AB
CIPROFLOXACIN: >=4 — AB
ERTAPENEM: <=0.5 — AB
ESBL: NEGATIVE
GENTAMICIN: <=1 — AB
IMIPENEM: <=0.25 — AB
MEROPENEM: <=0.25 — AB
NITROFURANTOIN: <=16 — AB
TOBRAMYCIN: <=1 — AB
TRIMETHOPRIM/SULFAMETHOXAZOLE: >=320 — AB

## 2013-05-24 NOTE — Progress Notes (Signed)
[facility]  Progress Note    Subjective:     Brittany Bailey is a 63 y.o. female here for follow-up of blood in her urine.  Came 6 wks ago with symptoms of UTI.  No leukocytes, bacteria or nitrites but was + for 2-5 RBC's.  Will obtain repeat urine today to confirm resolution of hematuria.  Having UTI type symptoms again with suprapubic pressure, low back pain and darker colored urine.  They have been off and on since last visit.  Did not obtain culture or treat at that time.  She is a never-smoker and was not around smokers growing up.  No history of chemical exposures.  No weight loss, upper back pain, fevers.  No history of kidney problems.    Review of Systems: All others negative    Objective:     Vitals: Pulse 97   Temp(Src) 36.9 C (98.4 F) (Oral)   Resp 18   Wt 144.743 kg (319 lb 1.6 oz)   BMI 49.21 kg/m2   SpO2 97%  General: appears in good health and no distress  Lungs: clear to auscultation bilaterally.   Cardiovascular:    Heart regular rate and rhythm  Abdomen: bowel sounds normal, no hepatosplenomegaly and palpation: Soft and Tenderness: suprapubic  Extremities: no cyanosis or edema      Assessment/Plan:       ICD-9-CM    1. Low back pain 724.2 POCT URINE DIPSTICK   2. Hematuria 599.70 POCT URINE DIPSTICK     URINE CULTURE - [facility] ONLY     AMB CONSULT/REFERRAL [facility]   3. UTI (lower urinary tract infection) 599.0 nitrofurantoin (MACROBID) 100 mg Oral Capsule   4. Suprapubic pressure 789.09 URINE CULTURE - [facility] ONLY     AMB CONSULT/REFERRAL [facility]   will treat as UTI since symptoms never really resolved.  However, with persistent hematuria it is time for her to see urology    follow up after urology visit        Other medical issues include:  Patient Active Problem List   Diagnosis    Hypothyroidism       Family History   Problem Relation Age of Onset    Arthritis-rheumatoid Neg Hx     Arthritis-osteo Neg Hx     Asthma Neg Hx     Breast Cancer Neg Hx     Thyroid  Disease Mother     Hypertension Mother     Hypertension Father     Heart Attack Father     Thyroid Disease Sister     Heart Attack Brother     Hypertension Brother     No Known Problems Maternal Grandmother     No Known Problems Maternal Grandfather     No Known Problems Paternal Grandmother     No Known Problems Paternal Grandfather     No Known Problems Son     Thyroid Disease Sister     Hypertension Sister     No Known Problems Sister     Hypertension Brother     Hypertension Brother      Allergies   Allergen Reactions    Sulfa (Sulfonamides) Hives/ Urticaria    Penicillins Hives/ Urticaria       Burna CashLola Lainy Wrobleski, MD 05/24/2013, 12:48

## 2013-06-06 ENCOUNTER — Encounter (INDEPENDENT_AMBULATORY_CARE_PROVIDER_SITE_OTHER): Payer: Self-pay | Admitting: FAMILY MEDICINE

## 2013-06-29 ENCOUNTER — Other Ambulatory Visit (HOSPITAL_COMMUNITY): Payer: Self-pay | Admitting: Urology

## 2013-06-29 ENCOUNTER — Encounter (INDEPENDENT_AMBULATORY_CARE_PROVIDER_SITE_OTHER): Payer: Self-pay | Admitting: Urology

## 2013-06-29 ENCOUNTER — Ambulatory Visit (INDEPENDENT_AMBULATORY_CARE_PROVIDER_SITE_OTHER): Admitting: Urology

## 2013-06-29 ENCOUNTER — Other Ambulatory Visit (HOSPITAL_BASED_OUTPATIENT_CLINIC_OR_DEPARTMENT_OTHER)
Admission: RE | Admit: 2013-06-29 | Discharge: 2013-06-29 | Disposition: A | Discharge status: Home / Self Care | Attending: Urology | Admitting: Urology

## 2013-06-29 VITALS — BP 158/98 | HR 94 | Temp 97.9°F | Ht 67.5 in | Wt 327.0 lb

## 2013-06-29 DIAGNOSIS — R82998 Other abnormal findings in urine: Secondary | ICD-10-CM

## 2013-06-29 DIAGNOSIS — R109 Unspecified abdominal pain: Secondary | ICD-10-CM

## 2013-06-29 DIAGNOSIS — R319 Hematuria, unspecified: Secondary | ICD-10-CM | POA: Insufficient documentation

## 2013-06-29 DIAGNOSIS — R102 Pelvic and perineal pain: Secondary | ICD-10-CM

## 2013-06-29 NOTE — Progress Notes (Signed)
[facility]                                                                              PROGRESS NOTE    PATIENT NAME: Brittany Bailey, Brittany Bailey  HOSPITAL NUMBER:[id number]  DATE OF SERVICE:06/29/2013  DATE OF BIRTH: Jun 02, 1950    This is a 63 year old female presenting for evaluation of microscopic hematuria.    HISTORY OF PRESENT ILLNESS:  The patient recently developed symptoms of urinary tract infection which responded well to antimicrobial therapy.  She was treated with antibiotics and that has resolved some of the flank pain that she was having.  She continues to have some suprapubic fullness and pressure on rare occasion.  She denies ever having had gross hematuria, but she does admit to a history of kidney stones.    REVIEW OF SYSTEMS:  The patient underwent bilateral hip replacement while she was living in [place] a few years ago.  She denies any past urologic history.    SOCIAL HISTORY:  She denies any GI problems.    PHYSICAL EXAMINATION:  Alert, awake, oriented x3 in no acute distress.    ABDOMEN:  Soft, nontender, nondistended, no costovertebral angle tenderness.    GENITOURINARY:  Vaginal vault well supported.  No masses appreciated.  Urethral meatus appears normal.    Cystoscopy to evaluate her hematuria:  Urinalysis was obtained showing persistent microscopic hematuria despite treatment of infection; therefore, she taken for cystoscopy evaluation.  Informed consent was obtained.  She was taken to the procedure room, prepped and draped in the standard sterile manner after being placed in the frogleg position.  A 16-French flexible cystoscope was inserted per urethra up into the bladder.  There were some mild reactive changes noted around the bladder neck.  No evidence of bladder tumors, stones, or diverticula were visualized.    ASSESSMENT AND PLAN:  Microscopic hematuria with negative cystoscopy.  Plan for further evaluation with urinary cytology and a  renal ultrasound.  Follow up as needed if symptoms progress or worsen.      Trudie BucklerJohn Navjot Loera, MD      ZO/[id number]; D: 06/29/2013 09:35:30 T: 06/29/2013 15:15:15

## 2013-06-29 NOTE — Progress Notes (Signed)
06/29/13 0900   Medication Administration   Initials st   Medication Name Cipro 500   Medication Dose 500mg    Route of Administration PO   NDC # [id number]   LOT # [id number]   Expiration date 08/28/16   Manufacturer pack pharmaceuticals   Clinic Supplied Yes   Patient Supplied No   Comments: no adverse reaction noted

## 2013-06-29 NOTE — Progress Notes (Signed)
06/29/13 0800   Urine   Time collected 0830   Glucose Negative   Bilirubin Negative   Ketones Negative   Specific Gravity 1.015   Blood (urine) Small (1+)   pH 6.5   Protein Negative   Urobilinogen 2mg /dL   Nitrite Negative   Leukocytes Negative   Initials st

## 2013-06-29 NOTE — Progress Notes (Signed)
06/29/13 0936   Medication Administration   Initials st   Medication Name Lidocaine Jelly   Medication Dose 100mg    NDC # [id number]   LOT # [id number]   Expiration date 11/29/14   Manufacturer [employer]s   Clinic Supplied Yes   Patient Supplied No   Comments: no adverse reactions noted

## 2013-06-29 NOTE — Progress Notes (Signed)
Cipro 500 mg 1 tab po administered with no adverse reaction noted.  Lidocaine jelly 2% 100 mg administered to urethra with no adverse reaction noted.Marland Kitchen.Rella LarveSonjia Dylin Breeden, LPN  5/7/84694/03/2013, 62:9509:41

## 2013-06-30 LAB — HISTORICAL URINARY CYTOLOGY

## 2013-06-30 NOTE — Procedures (Signed)
Cysto neg

## 2013-07-04 ENCOUNTER — Ambulatory Visit (HOSPITAL_BASED_OUTPATIENT_CLINIC_OR_DEPARTMENT_OTHER)
Admission: RE | Admit: 2013-07-04 | Discharge: 2013-07-04 | Disposition: A | Discharge status: Home / Self Care | Source: Ambulatory Visit | Attending: Urology | Admitting: Urology

## 2013-07-04 DIAGNOSIS — R102 Pelvic and perineal pain: Secondary | ICD-10-CM

## 2013-07-04 DIAGNOSIS — R109 Unspecified abdominal pain: Secondary | ICD-10-CM | POA: Insufficient documentation

## 2013-07-04 DIAGNOSIS — R319 Hematuria, unspecified: Secondary | ICD-10-CM | POA: Insufficient documentation

## 2013-07-12 ENCOUNTER — Telehealth (INDEPENDENT_AMBULATORY_CARE_PROVIDER_SITE_OTHER): Payer: Self-pay | Admitting: Urology

## 2013-07-12 NOTE — Telephone Encounter (Signed)
Patient called requesting results of US and Urine Cytology from 06/29/13. Results given per request..Marland Kitchen.Rella LarveSonjia Haily Caley, LPN  9/52/84134/14/2015, 12:10

## 2013-08-11 ENCOUNTER — Ambulatory Visit (INDEPENDENT_AMBULATORY_CARE_PROVIDER_SITE_OTHER): Admitting: FAMILY MEDICINE

## 2013-08-11 ENCOUNTER — Encounter (INDEPENDENT_AMBULATORY_CARE_PROVIDER_SITE_OTHER): Payer: Self-pay | Admitting: FAMILY MEDICINE

## 2013-08-11 VITALS — BP 152/98 | HR 97 | Temp 97.4°F | Resp 16 | Ht 67.5 in | Wt 327.0 lb

## 2013-08-11 DIAGNOSIS — Z6841 Body Mass Index (BMI) 40.0 and over, adult: Secondary | ICD-10-CM

## 2013-08-11 DIAGNOSIS — J3489 Other specified disorders of nose and nasal sinuses: Secondary | ICD-10-CM

## 2013-08-11 DIAGNOSIS — J329 Chronic sinusitis, unspecified: Secondary | ICD-10-CM

## 2013-08-11 DIAGNOSIS — J309 Allergic rhinitis, unspecified: Secondary | ICD-10-CM

## 2013-08-11 DIAGNOSIS — R0981 Nasal congestion: Secondary | ICD-10-CM

## 2013-08-11 MED ORDER — DOXYCYCLINE HYCLATE 100 MG TABLET
100.0000 mg | ORAL_TABLET | Freq: Two times a day (BID) | ORAL | Status: AC
Start: 2013-08-11 — End: 2013-08-21

## 2013-08-11 MED ORDER — LORATADINE 10 MG TABLET
10.0000 mg | ORAL_TABLET | Freq: Every day | ORAL | Status: DC
Start: 2013-08-11 — End: 2014-03-14

## 2013-08-11 MED ORDER — MOMETASONE 50 MCG/ACTUATION NASAL SPRAY
2.0000 | Freq: Every day | NASAL | Status: DC
Start: 2013-08-11 — End: 2013-08-11

## 2013-08-11 MED ORDER — KETOROLAC 30 MG/ML (1 ML) INJECTION SOLUTION
30.0000 mg | Freq: Once | INTRAMUSCULAR | Status: AC
Start: 2013-08-11 — End: 2013-08-11

## 2013-08-11 MED ORDER — MOMETASONE 50 MCG/ACTUATION NASAL SPRAY
2.0000 | Freq: Every day | NASAL | Status: AC
Start: 2013-08-11 — End: ?

## 2013-08-11 MED ORDER — DOXYCYCLINE HYCLATE 100 MG TABLET
100.00 mg | ORAL_TABLET | Freq: Two times a day (BID) | ORAL | Status: DC
Start: 2013-08-11 — End: 2013-08-11

## 2013-08-11 NOTE — Progress Notes (Signed)
BP 152/98   Pulse 97   Temp(Src) 36.3 C (97.4 F) (Oral)   Resp 16   Ht 1.715 m (5' 7.5")   Wt 148.326 kg (327 lb)   BMI 50.43 kg/m2     SpO2 98%

## 2013-08-11 NOTE — Progress Notes (Signed)
08/11/13 1600   Medication Administration   Initials KM   Medication Name Toradol     30mg    XLK#[id number]#[id number]   Medication Dose 60MG /2ML   Route of Administration IM   Site Left Deltoid   NDC # [id number]   LOT # [id number]   Expiration date 05/02/15   Manufacturer [employer]   Clinic Supplied Yes   Patient Supplied No

## 2013-08-15 ENCOUNTER — Ambulatory Visit (HOSPITAL_BASED_OUTPATIENT_CLINIC_OR_DEPARTMENT_OTHER)
Admission: RE | Admit: 2013-08-15 | Discharge: 2013-08-15 | Disposition: A | Discharge status: Home / Self Care | Source: Ambulatory Visit

## 2013-08-15 ENCOUNTER — Ambulatory Visit (HOSPITAL_BASED_OUTPATIENT_CLINIC_OR_DEPARTMENT_OTHER)

## 2013-08-15 ENCOUNTER — Other Ambulatory Visit (HOSPITAL_BASED_OUTPATIENT_CLINIC_OR_DEPARTMENT_OTHER): Payer: Self-pay

## 2013-08-15 DIAGNOSIS — M545 Low back pain, unspecified: Secondary | ICD-10-CM | POA: Insufficient documentation

## 2013-08-15 DIAGNOSIS — G542 Cervical root disorders, not elsewhere classified: Secondary | ICD-10-CM

## 2013-08-15 DIAGNOSIS — G43809 Other migraine, not intractable, without status migrainosus: Secondary | ICD-10-CM

## 2013-08-15 DIAGNOSIS — G43909 Migraine, unspecified, not intractable, without status migrainosus: Secondary | ICD-10-CM | POA: Insufficient documentation

## 2013-08-15 DIAGNOSIS — M51379 Other intervertebral disc degeneration, lumbosacral region without mention of lumbar back pain or lower extremity pain: Secondary | ICD-10-CM | POA: Insufficient documentation

## 2013-08-15 DIAGNOSIS — M5030 Other cervical disc degeneration, unspecified cervical region: Secondary | ICD-10-CM | POA: Insufficient documentation

## 2013-08-15 DIAGNOSIS — M5137 Other intervertebral disc degeneration, lumbosacral region: Secondary | ICD-10-CM | POA: Insufficient documentation

## 2013-08-19 ENCOUNTER — Other Ambulatory Visit: Payer: Self-pay

## 2013-08-19 DIAGNOSIS — R1310 Dysphagia, unspecified: Secondary | ICD-10-CM

## 2013-08-23 ENCOUNTER — Ambulatory Visit
Admission: RE | Admit: 2013-08-23 | Discharge: 2013-08-23 | Disposition: A | Discharge status: Home / Self Care | Source: Ambulatory Visit

## 2013-08-23 DIAGNOSIS — K449 Diaphragmatic hernia without obstruction or gangrene: Secondary | ICD-10-CM | POA: Insufficient documentation

## 2013-08-23 DIAGNOSIS — R1310 Dysphagia, unspecified: Secondary | ICD-10-CM | POA: Insufficient documentation

## 2013-08-23 MED ORDER — BARIUM SULFATE 98 % ORAL POWDER FOR SUSPENSION
INHALATION_SUSPENSION | ORAL | Status: AC
Start: 2013-08-23 — End: 2013-08-23
  Filled 2013-08-23: qty 135

## 2013-08-23 MED ORDER — BARIUM SULFATE 700 MG TABLET
1.00 | ORAL_TABLET | ORAL | Status: AC
Start: 2013-08-23 — End: 2013-08-23
  Administered 2013-08-23 (×2): 1 via ORAL

## 2013-08-23 MED ORDER — BARIUM SULFATE 96 % (W/W) ORAL POWDER FOR SUSPENSION
INHALATION_SUSPENSION | ORAL | Status: AC
Start: 2013-08-23 — End: 2013-08-23
  Filled 2013-08-23: qty 1

## 2013-08-23 MED ORDER — BARIUM SULFATE 96 % (W/W) ORAL POWDER FOR SUSPENSION
176.00 g | INHALATION_SUSPENSION | ORAL | Status: AC
Start: 2013-08-23 — End: 2013-08-23
  Administered 2013-08-23: 176 g via ORAL

## 2013-08-28 NOTE — Progress Notes (Signed)
[facility]  Acute Visit Progress Note      Subjective:     Brittany SatoDenise Renee Bailey is a 63 y.o. female who complains of sinus and nasal congestion, sore throat, nasal blockage, post nasal drip and headache for almost 3 weeks.  She denies a history of wheezing and shortness of breath and denies a history of asthma. Patient  reports that she has never smoked. She does not have any smokeless tobacco history on file.  Has been having lots of problems with her sinuses and has been referred to ENT in past, never went.    Other medical issues include:  Patient Active Problem List   Diagnosis   . Hypothyroidism       Family History   Problem Relation Age of Onset   . Arthritis-rheumatoid Neg Hx    . Arthritis-osteo Neg Hx    . Asthma Neg Hx    . Breast Cancer Neg Hx    . Thyroid Disease Mother    . Hypertension Mother    . Hypertension Father    . Heart Attack Father    . Thyroid Disease Sister    . Heart Attack Brother    . Hypertension Brother    . No Known Problems Maternal Grandmother    . No Known Problems Maternal Grandfather    . No Known Problems Paternal Grandmother    . No Known Problems Paternal Grandfather    . No Known Problems Son    . Thyroid Disease Sister    . Hypertension Sister    . No Known Problems Sister    . Hypertension Brother    . Hypertension Brother      Allergies   Allergen Reactions   . Sulfa (Sulfonamides) Hives/ Urticaria   . Penicillins Hives/ Urticaria     Outpatient Prescriptions Marked as Taking for the 08/11/13 encounter (Office Visit) with Burna CashBurke, Gurfateh Mcclain, MD   Medication Sig   . amitriptyline (ELAVIL) 50 mg Oral Tablet Take 1 Tab (50 mg total) by mouth Every night   . Aspirin 81 mg Oral Tablet take 81 mg by mouth Once a day.   Marland Kitchen. CALCIUM CARBONATE (CALCIUM 600 ORAL) take  by mouth.   . Desloratadine (CLARINEX) 5 mg Oral Tablet take 5 mg by mouth Once a day.   . [EXPIRED] doxycycline 100 mg Oral Tablet Take 1 Tab (100 mg total) by mouth Twice daily for 10 days   . ESOMEPRAZOLE  MAGNESIUM (NEXIUM ORAL) take  by mouth.   . [EXPIRED] ketorolac (TORADOL) 30 mg/mL (1 mL) Injection Solution 1 mL (30 mg total) by Intramuscular route One time for 1 dose   . levothyroxine (SYNTHROID) 75 mcg Oral Tablet Take 75 mcg by mouth Once a day   . loratadine (CLARITIN) 10 mg Oral Tablet Take 1 Tab (10 mg total) by mouth Once a day   . mometasone (NASONEX) 50 mcg/actuation Nasal Spray, Non-Aerosol 2 Sprays by Nasal route Once a day   . Omega-3 Fatty Acids-Vitamin E (FISH OIL) 1,000 mg Oral Capsule take 1,000 mg by mouth Twice daily.   Marland Kitchen. VITAMIN B COMPLEX NO.12-NIACIN ORAL take  by mouth.       Review of Systems: All others negative    Objective:     BP 152/98   Pulse 97   Temp(Src) 36.3 C (97.4 F) (Oral)   Resp 16   Ht 1.715 m (5' 7.5")   Wt 148.326 kg (327 lb)   BMI 50.43 kg/m2  SpO2 98%   General: appears in good health and no distress  HENT:Nose with erythema. , Pharynx injected.   Neck: no adenopathy  Lungs: clear to auscultation bilaterally.   Cardiovascular:    Heart regular rate and rhythm without murmer  Skin: Skin warm and dry, No rashes and No lesions      Assessment/Plan:       ICD-9-CM    1. Allergic rhinitis 477.9 mometasone (NASONEX) 50 mcg/actuation Nasal Spray, Non-Aerosol     loratadine (CLARITIN) 10 mg Oral Tablet   2. Sinus congestion 478.19 OUTSIDE CONSULT/REFERRAL PROVIDER(AMB)   3. Sinus infection 473.9 doxycycline 100 mg Oral Tablet       Symptomatic therapy suggested: push fluids, rest, apply heat to sinuses prn and ROV prn if symptoms persist or worsen    follow up prn    Burna Cash, MD 08/28/2013, 20:10

## 2013-09-06 ENCOUNTER — Ambulatory Visit (INDEPENDENT_AMBULATORY_CARE_PROVIDER_SITE_OTHER): Admitting: FAMILY MEDICINE

## 2013-09-06 ENCOUNTER — Other Ambulatory Visit (HOSPITAL_BASED_OUTPATIENT_CLINIC_OR_DEPARTMENT_OTHER)
Admission: RE | Admit: 2013-09-06 | Discharge: 2013-09-06 | Disposition: A | Discharge status: Home / Self Care | Attending: FAMILY MEDICINE | Admitting: FAMILY MEDICINE

## 2013-09-06 ENCOUNTER — Encounter (INDEPENDENT_AMBULATORY_CARE_PROVIDER_SITE_OTHER): Payer: Self-pay | Admitting: FAMILY MEDICINE

## 2013-09-06 VITALS — BP 128/92 | HR 93 | Temp 98.4°F | Resp 16 | Ht 67.5 in | Wt 333.0 lb

## 2013-09-06 DIAGNOSIS — E039 Hypothyroidism, unspecified: Secondary | ICD-10-CM

## 2013-09-06 DIAGNOSIS — R519 Headache, unspecified: Secondary | ICD-10-CM

## 2013-09-06 DIAGNOSIS — R51 Headache: Secondary | ICD-10-CM

## 2013-09-06 DIAGNOSIS — Z6841 Body Mass Index (BMI) 40.0 and over, adult: Secondary | ICD-10-CM

## 2013-09-06 DIAGNOSIS — G43909 Migraine, unspecified, not intractable, without status migrainosus: Secondary | ICD-10-CM

## 2013-09-06 LAB — BASIC METABOLIC PROFILE - BMC/JMC ONLY
ANION GAP: 8 mmol/L (ref 3–11)
BUN: 15 mg/dL (ref 6–22)
CALCIUM: 9.3 mg/dL (ref 8.5–10.5)
CARBON DIOXIDE: 28 mmol/L (ref 22–32)
CHLORIDE: 105 mmol/L (ref 101–111)
CREATININE: 0.83 mg/dL (ref 0.53–1.00)
ESTIMATED GLOMERULAR FILTRATION RATE: >60 mL/min (ref >60)
GLUCOSE: 93 mg/dL (ref 70–110)
GLUCOSE: 93 mg/dL (ref 70–110)
POTASSIUM: 4.2 mmol/L (ref 3.5–5.0)
SODIUM: 141 mmol/L (ref 136–145)

## 2013-09-06 LAB — MAGNESIUM: MAGNESIUM: 2 mg/dL (ref 1.7–2.5)

## 2013-09-06 MED ORDER — TOPIRAMATE 50 MG TABLET
50.00 mg | ORAL_TABLET | Freq: Two times a day (BID) | ORAL | Status: DC
Start: 2013-09-06 — End: 2013-10-06

## 2013-09-06 NOTE — Progress Notes (Signed)
SUBJECTIVE:   Brittany Bailey is a 63 y.o. female here for follow-up of chronic migraines.  Several year history of this.  Frequency of headaches: daily at this point.  Duration of headaches: several hours.  Responding to abortive therapy: no.  Satisfied with control: no.  Last 2-3 weeks pain has always been right frontal instead of alternating between left and right.  Elavil helps her sleep and has helped slightly with intensity of headaches but really not much.  Topamax worked well for her in past and wants to try this.  Frustrated because she has never had them quite this often in past.  She did not get to see ENT yet.  She has been taking her allergy medications regularly.  Mom had headaches and they were found to be from low magnesium.  She has never had this checked.  We were worried about her BP but she's been taking it at home and it has been running 120's-low 130's/80s    Also has low thyroid and due for TSH.      Current Outpatient Prescriptions   Medication Sig Dispense Refill    amitriptyline (ELAVIL) 50 mg Oral Tablet Take 1 Tab (50 mg total) by mouth Every night  30 Tab  5    Aspirin 81 mg Oral Tablet take 81 mg by mouth Once a day.        CALCIUM CARBONATE (CALCIUM 600 ORAL) take  by mouth.        Desloratadine (CLARINEX) 5 mg Oral Tablet take 5 mg by mouth Once a day.        ESOMEPRAZOLE MAGNESIUM (NEXIUM ORAL) take  by mouth.        levothyroxine (SYNTHROID) 75 mcg Oral Tablet Take 75 mcg by mouth Once a day        loratadine (CLARITIN) 10 mg Oral Tablet Take 1 Tab (10 mg total) by mouth Once a day  30 Tab  11    mometasone (NASONEX) 50 mcg/actuation Nasal Spray, Non-Aerosol 2 Sprays by Nasal route Once a day  1 Bottle  5    Omega-3 Fatty Acids-Vitamin E (FISH OIL) 1,000 mg Oral Capsule take 1,000 mg by mouth Twice daily.        ondansetron (ZOFRAN) 4 mg Oral Tablet Take 1 Tab (4 mg total) by mouth Every 12 hours as needed for nausea/vomiting  10 Tab  0    SUMAtriptan (IMITREX) 50 mg  Oral Tablet Take 1 Tab (50 mg total) by mouth Once, as needed for Migraine for up to 1 dose May repeat in 2 hours in needed  10 Tab  5    Topiramate (TOPAMAX) 50 mg Oral Tablet Take 1 Tab (50 mg total) by mouth Twice daily  30 Tab  0    VITAMIN B COMPLEX NO.12-NIACIN ORAL take  by mouth.         No current facility-administered medications for this visit.         ROS:  There are no associated abnormal neurological symptoms such as TIAs, loss of balance, loss of vision or speech, numbness or weakness on review. Past neurological history: negative for stroke, MS, epilepsy, or brain tumor.  All others negative.    OBJECTIVE:   Patient appears in pain, preferring to lie in a darkened room. Her vitals are normal.  Alert and oriented x 3.  Ears and throat normal. Neck fully supple without nodes. Sinuses non-tender. Cranial nerves are normal.  Fundi are normal with sharp disc margins,  no papilledema, hemorrhages or exudates noted. DTRs normal and symmetric. Babinski sign absent.  Mental status normal. Cerebellar function normal.     ASSESSMENT:   Migraine headaches not well controlled, not certain this is the cause of her daily headaches.  Could be a chronic sinusitis at this point given the unilateral nature.  Could be tension or other headaches in addition to migraines.  Seems like it is not her BP since this has been normal or near-normal at home.  Doesn't use OTC meds much so doesn't seem to be medication overuse.    PLAN:   1. Right-sided headache    - OUTSIDE CONSULT/REFERRAL PROVIDER(AMB)  - CT SINUSES WO IV CONTRAST; Future  - BASIC METABOLIC PROFILE - [facility] ONLY; Future  - MAGNESIUM; Future    2. Migraines    - Topiramate (TOPAMAX) 50 mg Oral Tablet; Take 1 Tab (50 mg total) by mouth Twice daily  Dispense: 30 Tab; Refill: 0; adding this but since elavil helps her sleep will not stop this  - BASIC METABOLIC PROFILE - [facility] ONLY; Future  - MAGNESIUM; Future    3. Hypothyroid    - TSH CASCADE,3RD GEN WITH  REFLEX TO FT4 - [facility] ONLY; Future  - BASIC METABOLIC PROFILE - [facility] ONLY; Future      Burna Cash, MD  09/06/2013, 08:47

## 2013-09-06 NOTE — Progress Notes (Signed)
BP 128/92   Pulse 93   Temp(Src) 36.9 C (98.4 F) (Oral)   Resp 16   Ht 1.715 m (5' 7.5")   Wt 151.048 kg (333 lb)   BMI 51.36 kg/m2     SpO2 98%   [employer], LPN  09/03/2945, 65:46

## 2013-09-07 ENCOUNTER — Ambulatory Visit (HOSPITAL_BASED_OUTPATIENT_CLINIC_OR_DEPARTMENT_OTHER)
Admission: RE | Admit: 2013-09-07 | Discharge: 2013-09-07 | Disposition: A | Discharge status: Home / Self Care | Source: Ambulatory Visit | Attending: FAMILY MEDICINE | Admitting: FAMILY MEDICINE

## 2013-09-07 DIAGNOSIS — R51 Headache: Secondary | ICD-10-CM | POA: Insufficient documentation

## 2013-09-07 DIAGNOSIS — R519 Headache, unspecified: Secondary | ICD-10-CM

## 2013-09-16 ENCOUNTER — Telehealth (INDEPENDENT_AMBULATORY_CARE_PROVIDER_SITE_OTHER): Payer: Self-pay

## 2013-09-16 NOTE — Telephone Encounter (Signed)
Showed mild sinus disease on left but nothing on right where headache has been.  No infection so nothing that needs to be treated.

## 2013-09-16 NOTE — Telephone Encounter (Signed)
Patient called regarding CT results, but advised patient that they have not been signed off yet. Please review and I will call patient. Thank you.

## 2013-09-16 NOTE — Telephone Encounter (Signed)
Patient notified of CT results and verbalized understanding.

## 2013-09-19 ENCOUNTER — Telehealth (INDEPENDENT_AMBULATORY_CARE_PROVIDER_SITE_OTHER): Payer: Self-pay | Admitting: FAMILY MEDICINE

## 2013-09-19 NOTE — Telephone Encounter (Signed)
Ok to release lab results?

## 2013-09-19 NOTE — Telephone Encounter (Signed)
Pt aware. KM

## 2013-09-19 NOTE — Telephone Encounter (Signed)
Ms Manson PasseyBrown called to check the status of her lab results. She would like a return call to [phone].  Elicia LampKim Carper, Registration Specialist

## 2013-09-19 NOTE — Telephone Encounter (Signed)
Yes everything is normal.

## 2013-09-26 ENCOUNTER — Other Ambulatory Visit (INDEPENDENT_AMBULATORY_CARE_PROVIDER_SITE_OTHER): Payer: Self-pay | Admitting: FAMILY MEDICINE

## 2013-09-28 MED ORDER — AMITRIPTYLINE 50 MG TABLET
50.0000 mg | ORAL_TABLET | Freq: Every evening | ORAL | Status: DC
Start: 2013-09-26 — End: 2014-05-05

## 2013-09-28 NOTE — Telephone Encounter (Signed)
[facility] called to check the status of Ms Bulthuis's refill.  Elicia LampKim Carper, Registration Specialist

## 2013-09-28 NOTE — Progress Notes (Signed)
Quick Note:    Dear Brittany Bailey,   Here are the results of your recent labwork. Everything looks normal. Let me know if you have any questions!   Dr. Jalaiya Oyster    ______

## 2013-09-28 NOTE — Progress Notes (Signed)
Quick Note:    CT shows slight congestion of left cheek sinus. Nothing on the right where the headache is so I don't believe this is causing you any symptoms. Also since it is mild and chronic there is no need to treat other than to continue treating allergies. Let me know if you have any questions!  ______

## 2013-10-06 ENCOUNTER — Other Ambulatory Visit (INDEPENDENT_AMBULATORY_CARE_PROVIDER_SITE_OTHER): Payer: Self-pay | Admitting: FAMILY MEDICINE

## 2013-10-06 MED ORDER — TOPIRAMATE 50 MG TABLET
50.0000 mg | ORAL_TABLET | Freq: Two times a day (BID) | ORAL | Status: DC
Start: 2013-10-06 — End: 2013-10-18

## 2013-10-18 ENCOUNTER — Other Ambulatory Visit (INDEPENDENT_AMBULATORY_CARE_PROVIDER_SITE_OTHER): Payer: Self-pay | Admitting: Family Medicine

## 2013-12-03 ENCOUNTER — Encounter (HOSPITAL_BASED_OUTPATIENT_CLINIC_OR_DEPARTMENT_OTHER): Payer: Self-pay

## 2013-12-03 ENCOUNTER — Emergency Department (HOSPITAL_BASED_OUTPATIENT_CLINIC_OR_DEPARTMENT_OTHER)
Admission: EM | Admit: 2013-12-03 | Discharge: 2013-12-03 | Disposition: A | Discharge status: Home / Self Care | Attending: Emergency Medicine | Admitting: Emergency Medicine

## 2013-12-03 DIAGNOSIS — K219 Gastro-esophageal reflux disease without esophagitis: Secondary | ICD-10-CM | POA: Insufficient documentation

## 2013-12-03 DIAGNOSIS — E079 Disorder of thyroid, unspecified: Secondary | ICD-10-CM | POA: Insufficient documentation

## 2013-12-03 DIAGNOSIS — R319 Hematuria, unspecified: Secondary | ICD-10-CM | POA: Insufficient documentation

## 2013-12-03 DIAGNOSIS — Z7982 Long term (current) use of aspirin: Secondary | ICD-10-CM | POA: Insufficient documentation

## 2013-12-03 DIAGNOSIS — N390 Urinary tract infection, site not specified: Secondary | ICD-10-CM | POA: Insufficient documentation

## 2013-12-03 HISTORY — DX: Headache: R51

## 2013-12-03 HISTORY — DX: Reserved for inherently not codable concepts without codable children: IMO0001

## 2013-12-03 LAB — URINALYSIS (ROUTINE) - BMC ONLY
BILIRUBIN,URINE: NEGATIVE mg/dL
GLUCOSE, URINE: NEGATIVE mg/dL
KETONES,URINE: NEGATIVE mg/dL
NITRITES,URINE: NEGATIVE
PH,URINE: 8 — AB (ref <8.0)
PROTEIN, URINE, RANDOM: 100 mg/dL — AB
RBC,URINE: >100 /HPF — AB (ref 0–2)
SPECIFIC GRAVITY,URINE: 1.019 (ref <1.022)
UROBILINOGEN, URINE: <2 mg/dL (ref <2.0)
WBC URINE: >100 /hpf — AB (ref 0–2)

## 2013-12-03 LAB — CBC
BASOPHIL #: 0 K/uL (ref 0.0–0.1)
BASOPHILS %: 0.6 % (ref 0.0–2.5)
EOSINOPHIL #: 0.2 10*3/uL (ref 0.0–0.5)
EOSINOPHIL %: 2.3 % (ref 0.0–5.2)
HCT: 44 % (ref 36.0–45.0)
HGB: 14.9 g/dL (ref 12.0–15.5)
LYMPHOCYTE #: 1.2 K/uL (ref 0.7–3.2)
LYMPHOCYTE %: 15.3 % (ref 15.0–43.0)
MCH: 29.6 pg (ref 28.3–34.3)
MCHC: 34 g/dL (ref 32.0–36.0)
MCV: 87.2 fL (ref 82.0–97.0)
MONOCYTE #: 0.7 10*3/uL (ref 0.2–0.9)
MONOCYTE %: 8.8 % (ref 4.8–12.0)
MPV: 8.1 fL (ref 7.4–10.5)
NRBC ABSOLUTE: 0 K/uL (ref 0–0.02)
NRBC: 0 /100 WBC (ref 0–0.3)
PLATELET COUNT: 171 K/uL (ref 150–450)
PMN #: 5.8 K/uL (ref 1.5–6.5)
PMN %: 73 % (ref 43.0–76.0)
RBC: 5.04 M/uL (ref 4.00–5.10)
RBC: 5.04 M/uL (ref 4.00–5.10)
RDW: 14 % (ref 10.5–14.5)
WBC: 7.9 K/uL (ref 4.0–11.0)

## 2013-12-03 LAB — BASIC METABOLIC PROFILE - BMC/JMC ONLY
ANION GAP: 6 mmol/L (ref 3–11)
BUN: 16 mg/dL (ref 6–22)
CALCIUM: 9 mg/dL (ref 8.5–10.5)
CARBON DIOXIDE: 23 mmol/L (ref 22–32)
CHLORIDE: 109 mmol/L (ref 101–111)
CREATININE: 0.93 mg/dL (ref 0.53–1.00)
ESTIMATED GLOMERULAR FILTRATION RATE: >60 mL/min (ref >60)
GLUCOSE: 108 mg/dL (ref 70–110)
GLUCOSE: 108 mg/dL (ref 70–110)
POTASSIUM: 4.2 mmol/L (ref 3.5–5.0)
SODIUM: 138 mmol/L (ref 136–145)

## 2013-12-03 MED ORDER — NITROFURANTOIN MONOHYDRATE/MACROCRYSTALS 100 MG CAPSULE
100.00 mg | ORAL_CAPSULE | Freq: Two times a day (BID) | ORAL | Status: AC
Start: 2013-12-03 — End: 2013-12-08

## 2013-12-03 MED ORDER — PHENAZOPYRIDINE 200 MG TABLET
200.00 mg | ORAL_TABLET | Freq: Three times a day (TID) | ORAL | Status: DC | PRN
Start: 2013-12-03 — End: 2014-03-14

## 2013-12-03 NOTE — ED Nurses Note (Signed)
Pt c/o lower abd pressure and pain with urination noted blood in urine.

## 2013-12-03 NOTE — ED Provider Notes (Signed)
Caprice Red, MD  [facility]  Emergency Department Visit Note    Date:  12/03/2013  Primary care provider:  Burna Cash, MD  Means of arrival:  private car  History obtained from: patient  History limited by: none    Chief Complaint:  Hematuria     HISTORY OF PRESENT ILLNESS     Brittany Bailey, date of birth 18-Jul-1950, is a 63 y.o. female who presents to the Emergency Department complaining of mild, intermittent hematuria and moderate dysuria. Patient reports that she felt that she was having pressure to the lower abdomen area while voiding yesterday along with urinary frequency. Patient states that this continued last night and worsened this morning. Patient adds that she wiped after voiding today and noticed some blood and then looked in the toilet and noted that her urine was bloody.  Patient has a history of a kidney infection and UTIs and says that her symptoms are similar to the symptoms in the past although she denies back/flank pain, nausea, vomiting, diarrhea, fever, hematochezia, vaginal bleeding, chest pain, or shortness of breath.  She affirms having some chills last night.    REVIEW OF SYSTEMS     The pertinent positive and negative symptoms are as per HPI. All other systems reviewed and are negative.     PATIENT HISTORY     Past Medical History:  Past Medical History   Diagnosis Date    Thyroid disease     Overactive bladder     Reflux     Headache(784.0)        Past Surgical History:  Past Surgical History   Procedure Laterality Date    Hx knee surgery       TKA bilateral    Hx gall bladder surgery/chole      Hx gastric bypass       lap band       Family History:   Family History   Problem Relation Age of Onset    Arthritis-rheumatoid Neg Hx     Arthritis-osteo Neg Hx     Asthma Neg Hx     Breast Cancer Neg Hx     Thyroid Disease Mother     Hypertension Mother     Hypertension Father     Heart Attack Father     Thyroid Disease Sister     Heart Attack Brother      Hypertension Brother     No Known Problems Maternal Grandmother     No Known Problems Maternal Grandfather     No Known Problems Paternal Grandmother     No Known Problems Paternal Grandfather     No Known Problems Son     Thyroid Disease Sister     Hypertension Sister     No Known Problems Sister     Hypertension Brother     Hypertension Brother        Social History:  History   Substance Use Topics    Smoking status: Never Smoker     Smokeless tobacco: Not on file    Alcohol Use: No     History   Drug Use No       Medications:  Previous Medications    AMITRIPTYLINE (ELAVIL) 50 MG ORAL TABLET    Take 1 Tab (50 mg total) by mouth Every night    ASPIRIN 81 MG ORAL TABLET    take 81 mg by mouth Once a day.    CALCIUM CARBONATE (CALCIUM 600 ORAL)  take  by mouth.    DESLORATADINE (CLARINEX) 5 MG ORAL TABLET    take 5 mg by mouth Once a day.    ESOMEPRAZOLE MAGNESIUM (NEXIUM ORAL)    take  by mouth.    LEVOTHYROXINE (SYNTHROID) 75 MCG ORAL TABLET    Take 75 mcg by mouth Once a day    LORATADINE (CLARITIN) 10 MG ORAL TABLET    Take 1 Tab (10 mg total) by mouth Once a day    MOMETASONE (NASONEX) 50 MCG/ACTUATION NASAL SPRAY, NON-AEROSOL    2 Sprays by Nasal route Once a day    OMEGA-3 FATTY ACIDS-VITAMIN E (FISH OIL) 1,000 MG ORAL CAPSULE    take 1,000 mg by mouth Twice daily.    ONDANSETRON (ZOFRAN) 4 MG ORAL TABLET    Take 1 Tab (4 mg total) by mouth Every 12 hours as needed for nausea/vomiting    SUMATRIPTAN (IMITREX) 50 MG ORAL TABLET    Take 1 Tab (50 mg total) by mouth Once, as needed for Migraine for up to 1 dose May repeat in 2 hours in needed    TOPIRAMATE (TOPAMAX) 50 MG ORAL TABLET    TAKE 1 TAB (50 MG TOTAL) BY MOUTH TWICE DAILY    VITAMIN B COMPLEX NO.12-NIACIN ORAL    take  by mouth.       Allergies:  Allergies   Allergen Reactions    Sulfa (Sulfonamides) Hives/ Urticaria    Penicillins Hives/ Urticaria       PHYSICAL EXAM     Vitals:  Filed Vitals:    12/03/13 0912   BP: 170/105   Pulse:  113   Temp: 36.8 C (98.2 F)   Resp: 18   SpO2: 98%     Pulse ox  98% on None (Room Air) interpreted by me as: Normal    Pulse: 88    Constitutional: Obese, elderly female. No acute distress.   Head: Normocephalic and atraumatic.   ENT: Moist mucous membranes. No erythema or exudates in the oropharynx.  Eyes: EOM are normal. Pupils are equal, round, and reactive to light. No scleral icterus.   Neck: Neck supple. No meningismus.  Cardiovascular: Normal rate and regular rhythm. Normal S1 and S2. Exam reveals no gallop and no friction rub.  No murmur heard.  Pulmonary/Chest: Effort normal and breath sounds normal.   Abdominal: Soft. No distension. There is no tenderness. No organomegaly. Normal bowel sounds present.  Back: There is no CVA tenderness.  Musculoskeletal: Bilateral 1+ pitting edema to lower extremities. Normal range of motion. No extremity tenderness. No clubbing or cyanosis.  Lymphadenopathy: No cervical adenopathy.   Neurological: Patient is alert and oriented to person, place, and time. Strength and sensation normal in all extremities. Cranial nerves II - XII intact. Normal speech.  Skin: Skin is warm and dry. No rash noted.      DIAGNOSTIC STUDIES     Labs:    Results for orders placed or performed during the hospital encounter of 12/03/13   CBC   Result Value Ref Range    WBC 7.9 4.0 - 11.0 K/uL    RBC 5.04 4.00 - 5.10 M/uL    HGB 14.9 12.0 - 15.5 g/dL    HCT 16.1 09.6 - 04.5 %    MCV 87.2 82.0 - 97.0 fL    MCH 29.6 28.3 - 34.3 pg    MCHC 34.0 32.0 - 36.0 g/dL    RDW 40.9 81.1 - 91.4 %    PLATELET  COUNT 171 (U) 150 - 450 K/uL    MPV 8.1 7.4 - 10.5 fL    NRBC 0 0 - 0.3 /100 WBC    NRBC ABSOLUTE 0.00 0 - 0.02 K/uL    PMN % 73.0 43.0 - 76.0 %    LYMPHOCYTE % 15.3 15.0 - 43.0 %    MONOCYTE % 8.8 4.8 - 12.0 %    EOSINOPHIL % 2.3 0.0 - 5.2 %    BASOPHILS % 0.6 0.0 - 2.5 %    PMN # 5.8 1.5 - 6.5 K/uL    LYMPHOCYTE # 1.2 0.7 - 3.2 K/uL    MONOCYTE # 0.7 0.2 - 0.9 K/uL    EOSINOPHIL # 0.2 0.0 - 0.5 K/uL     BASOPHIL # 0.0 0.0 - 0.1 K/uL   BASIC METABOLIC PROFILE - [facility] ONLY   Result Value Ref Range    GLUCOSE 108 70 - 110 mg/dL    BUN 16 6 - 22 mg/dL    CREATININE 7.32 2.02 - 1.00 mg/dL    ESTIMATED GLOMERULAR FILTRATION RATE >60 >60 ml/min    SODIUM 138 136 - 145 mmol/L    POTASSIUM 4.2 3.5 - 5.0 mmol/L    CHLORIDE 109 101 - 111 mmol/L    CARBON DIOXIDE 23 22 - 32 mmol/L    ANION GAP 6 3 - 11 mmol/L    CALCIUM 9.0 8.5 - 10.5 mg/dL   URINALYSIS (ROUTINE) - [facility] ONLY   Result Value Ref Range    SOURCE, URINE CC     COLOR,URINE DK. YELLOW YELLOW    APPEARANCE,URINE CLOUDY (A) CLEAR    GLUCOSE, URINE NEGATIVE NEGATIVE mg/dL    BILIRUBIN,URINE NEGATIVE NEGATIVE mg/dL    KETONES,URINE NEGATIVE NEGATIVE mg/dL    SPECIFIC GRAVITY,URINE 1.019 <1.022    BLOOD,URINE LARGE (A) NEGATIVE mg/dL    PH,URINE 8.0 (A) <5.4    PROTEIN, URINE, RANDOM 100 (A) NEGATIVE mg/dL    UROBILINOGEN, URINE <2.0 <2.0 mg/dL    NITRITES,URINE NEGATIVE NEGATIVE    LEUKOCYTE ESTERASE,URINE LARGE (A) NEGATIVE LEU/uL    WBC URINE >100 (A) 0 - 2 /hpf    RBC,URINE >100 (A) 0 - 2 /hpf    SQUAMOUS EPITHELIAL CELLS,UR 0-2 0 - 2 /hpf    BACTERIA,URINE MODERATE (A) NONE    MUCUS,URINE SLIGHT NONE-SLT     Labs reviewed and interpreted by me.    ED PROGRESS NOTE / MEDICAL DECISION MAKING     Old records reviewed by me:  I have reviewed the nurse's notes. I have reviewed the patient's problem list. I have reviewed the patient's relevant previous records. Urine culture from February, resulting in E-coli, resistant to Bactrim, Augmentin, and Cipro. Ultrasound in April showing negative normal results.      Orders Placed This Encounter    CBC    BASIC METABOLIC PROFILE - [facility] ONLY    URINALYSIS (ROUTINE) - [facility] ONLY     Labs ordered.    1005: Initial evaluation is complete at this time. I explained to the patient how to collect her urine for a urinalysis. I discussed with the patient that I would order labs and reevaluate shortly to check the patient's progress.  Patient is agreeable with the treatment plan at this time.    1213: On recheck, the patient is doing well. I explained the results of the diagnostic studies which are indicating a UTI as the cause of her lower abdominal discomfort and dysuria. Patient will be discharged with Macrobid  and Pyridium. I discussed the diagnosis, disposition, and follow-up plan. The patient understood and is in accordance with the treatment plan at this time. Return precautions to the Emergency Department were discussed. All of her questions have been answered to her satisfaction. The patient is in stable condition at the time of discharge.     Pre-Disposition Vitals:  Filed Vitals:    12/03/13 0912 12/03/13 1126   BP: 170/105 144/69   Pulse: 113 84   Temp: 36.8 C (98.2 F)    Resp: 18 16   SpO2: 98% 96%     CLINICAL IMPRESSION     1. Urinary tract infection  2. Hematuria    DISPOSITION/PLAN     Discharged        Prescriptions:     New Prescriptions    NITROFURANTOIN (MACROBID) 100 MG ORAL CAPSULE    Take 1 Cap (100 mg total) by mouth Twice daily for 5 days    PHENAZOPYRIDINE (PYRIDIUM) 200 MG ORAL TABLET    Take 1 Tab (200 mg total) by mouth Three times a day as needed for Pain     Follow-Up:     Burna Cash, MD  [address]  [place] [address]  [phone]    As needed    [facility]  [address]  [place] [address]  [phone]    immediately if symptoms worsen or, new symptoms develop    Pre-hypertension/Hypertension: The patient has been informed that they may have pre-hypertension or Hypertension based on a blood pressure reading in the emergency department. I recommend that the patient call the primary care provider listed on their discharge instructions or a physician of their choice this week to arrange follow up for further evaluation of possible pre-hypertension or Hypertension.  Condition at Disposition: Stable      SCRIBE ATTESTATION STATEMENT  I Corey Harold, SCRIBE  scribed for Caprice Red, MD on 12/03/2013 at 9:55 AM.     Documentation assistance provided for Kesley Mullens, Cristal Deer, MD  by Corey Harold, SCRIBE. Information recorded by the scribe was done at my direction and has been reviewed and validated by me Clydine Parkison, Cristal Deer, MD.

## 2013-12-03 NOTE — ED Nurses Note (Signed)
Lunch relief ended, report of headache passed to assigned RN.

## 2013-12-03 NOTE — ED Nurses Note (Signed)
Patient given macrobid, pyridium scripts with d/c patient verbalizes understanding of d/c.

## 2013-12-03 NOTE — ED Nurses Note (Signed)
Lunch coverage, awaiting U/A result, had not been 'collected'. Rails up x 2, says beginning to have migraine pain.

## 2013-12-03 NOTE — Discharge Instructions (Signed)

## 2013-12-14 ENCOUNTER — Ambulatory Visit (INDEPENDENT_AMBULATORY_CARE_PROVIDER_SITE_OTHER): Admitting: FAMILY MEDICINE

## 2013-12-14 ENCOUNTER — Other Ambulatory Visit (HOSPITAL_BASED_OUTPATIENT_CLINIC_OR_DEPARTMENT_OTHER)
Admission: RE | Admit: 2013-12-14 | Discharge: 2013-12-14 | Disposition: A | Discharge status: Home / Self Care | Attending: FAMILY MEDICINE | Admitting: FAMILY MEDICINE

## 2013-12-14 ENCOUNTER — Encounter (INDEPENDENT_AMBULATORY_CARE_PROVIDER_SITE_OTHER): Payer: Self-pay | Admitting: FAMILY MEDICINE

## 2013-12-14 VITALS — BP 142/100 | HR 103 | Temp 97.7°F | Resp 18 | Ht 67.0 in | Wt 333.4 lb

## 2013-12-14 DIAGNOSIS — N390 Urinary tract infection, site not specified: Secondary | ICD-10-CM

## 2013-12-14 DIAGNOSIS — Z6841 Body Mass Index (BMI) 40.0 and over, adult: Secondary | ICD-10-CM

## 2013-12-14 MED ORDER — CIPROFLOXACIN 500 MG TABLET
500.00 mg | ORAL_TABLET | Freq: Two times a day (BID) | ORAL | Status: AC
Start: 2013-12-14 — End: 2013-12-19

## 2013-12-14 NOTE — Progress Notes (Signed)
12/14/13 1100   Urine   Time collected 1128   Glucose Negative   Bilirubin Negative   Ketones Negative   Specific Gravity 1.015   Blood (urine) Moderate Intact   pH 7.0   Protein Negative   Urobilinogen Normal    Nitrite Negative   Leukocytes (!) 3+   Initials SQ

## 2013-12-14 NOTE — Progress Notes (Signed)
BP 142/100 mmHg   Pulse 103   Temp(Src) 36.5 C (97.7 F) (Oral)   Resp 18   Ht 1.702 m ( )   Wt 151.229 kg (333 lb 6.4 oz)   BMI 52.21 kg/m2   SpO2 98%

## 2013-12-16 ENCOUNTER — Encounter (INDEPENDENT_AMBULATORY_CARE_PROVIDER_SITE_OTHER): Admitting: FAMILY MEDICINE

## 2013-12-17 LAB — URINE CULTURE
AMIKACIN: <=2 — AB
AMPICILLIN/SULBACTAM: <=2 — AB
AMPICILLIN: <=2 — AB
AZTREONAM: <=1 — AB
CEFAZOLIN: <=4 — AB
CEFEPIME: <=1 — AB
CFU-GM: >100000 [CFU]/mL
CIPROFLOXACIN: 1 — AB
ERTAPENEM: <=0.5 — AB
NITROFURANTOIN: 128 — AB
TOBRAMYCIN: <=1 — AB
TRIMETHOPRIM/SULFAMETHOXAZOLE: >=320 — AB

## 2013-12-19 ENCOUNTER — Other Ambulatory Visit (INDEPENDENT_AMBULATORY_CARE_PROVIDER_SITE_OTHER): Payer: Self-pay | Admitting: FAMILY MEDICINE

## 2013-12-19 MED ORDER — LEVOTHYROXINE 75 MCG TABLET
75.0000 ug | ORAL_TABLET | Freq: Every day | ORAL | Status: DC
Start: 2013-12-19 — End: 2014-01-16

## 2013-12-27 ENCOUNTER — Encounter (INDEPENDENT_AMBULATORY_CARE_PROVIDER_SITE_OTHER): Admitting: FAMILY MEDICINE

## 2013-12-28 ENCOUNTER — Other Ambulatory Visit (HOSPITAL_BASED_OUTPATIENT_CLINIC_OR_DEPARTMENT_OTHER)
Admission: RE | Admit: 2013-12-28 | Discharge: 2013-12-28 | Disposition: A | Discharge status: Home / Self Care | Attending: FAMILY MEDICINE | Admitting: FAMILY MEDICINE

## 2013-12-28 ENCOUNTER — Ambulatory Visit (INDEPENDENT_AMBULATORY_CARE_PROVIDER_SITE_OTHER): Admitting: FAMILY MEDICINE

## 2013-12-28 ENCOUNTER — Encounter (INDEPENDENT_AMBULATORY_CARE_PROVIDER_SITE_OTHER): Payer: Self-pay | Admitting: FAMILY MEDICINE

## 2013-12-28 VITALS — BP 126/88 | HR 84 | Temp 98.2°F | Resp 14 | Ht 67.0 in | Wt 333.0 lb

## 2013-12-28 DIAGNOSIS — Z6841 Body Mass Index (BMI) 40.0 and over, adult: Secondary | ICD-10-CM

## 2013-12-28 DIAGNOSIS — Z01818 Encounter for other preprocedural examination: Secondary | ICD-10-CM | POA: Insufficient documentation

## 2013-12-28 LAB — COMPREHENSIVE METABOLIC PROFILE - BMC/JMC ONLY
ALBUMIN/GLOBULIN RATIO: 1.8
ALBUMIN: 4.2 g/dL (ref 3.2–5.0)
ALKALINE PHOSPHATASE: 73 IU/L (ref 35–120)
ALT (SGPT): 43 IU/L (ref 0–55)
ANION GAP: 5 mmol/L (ref 3–11)
AST (SGOT): 29 IU/L (ref 0–45)
BILIRUBIN, TOTAL: 0.7 mg/dL (ref 0.0–1.3)
BUN: 17 mg/dL (ref 6–22)
CALCIUM: 9.2 mg/dL (ref 8.5–10.5)
CARBON DIOXIDE: 26 mmol/L (ref 22–32)
CARBON DIOXIDE: 26 mmol/L (ref 22–32)
CHLORIDE: 110 mmol/L (ref 101–111)
CREATININE: 0.82 mg/dL (ref 0.53–1.00)
ESTIMATED GLOMERULAR FILTRATION RATE: >60 mL/min (ref >60)
ESTIMATED GLOMERULAR FILTRATION RATE: >60 mL/min (ref >60)
GLUCOSE: 95 mg/dL (ref 70–110)
POTASSIUM: 4.3 mmol/L (ref 3.5–5.0)
SODIUM: 141 mmol/L (ref 136–145)
TOTAL PROTEIN: 6.5 g/dL (ref 6.0–8.0)

## 2013-12-28 LAB — CBC
BASOPHIL #: 0.1 10*3/uL (ref 0.0–0.1)
BASOPHILS %: 1.3 % (ref 0.0–2.5)
EOSINOPHIL %: 5.9 % — ABNORMAL HIGH (ref 0.0–5.2)
HCT: 42.6 % (ref 36.0–45.0)
HGB: 13.9 g/dL (ref 12.0–15.5)
HGB: 13.9 g/dL (ref 12.0–15.5)
LYMPHOCYTE #: 1.4 K/uL (ref 0.7–3.2)
LYMPHOCYTE %: 30.9 % (ref 15.0–43.0)
LYMPHOCYTE %: 30.9 % (ref 15.0–43.0)
MCH: 28.4 pg (ref 28.3–34.3)
MCHC: 32.5 g/dL (ref 32.0–36.0)
MCHC: 32.5 g/dL (ref 32.0–36.0)
MCV: 87.3 fL (ref 82.0–97.0)
MONOCYTE #: 0.4 K/uL (ref 0.2–0.9)
MONOCYTE %: 8.5 % (ref 4.8–12.0)
MPV: 9.4 fL (ref 7.4–10.5)
NRBC ABSOLUTE: 0 K/uL (ref 0–0.02)
NRBC: 0 /100{WBCs} (ref 0–0.3)
PLATELET COUNT: 189 10*3/uL (ref 150–450)
PLATELET COUNT: 189 K/uL (ref 150–450)
PMN %: 53.4 % (ref 43.0–76.0)
PMN %: 53.4 % (ref 43.0–76.0)
RBC: 4.88 M/uL (ref 4.00–5.10)
RDW: 13.9 % (ref 10.5–14.5)
WBC: 4.6 K/uL — AB (ref 4.0–11.0)

## 2013-12-28 LAB — MAGNESIUM: MAGNESIUM: 2 mg/dL (ref 1.7–2.5)

## 2013-12-28 LAB — LIPID PANEL
CHOL/HDL RATIO: 4.2
CHOLESTEROL: 220 mg/dL (ref 120–199)
HDL-CHOLESTEROL: 53 mg/dL (ref >39)
LDL (CALCULATED): 131 mg/dL — ABNORMAL HIGH (ref <130)
TRIGLYCERIDES: 178 mg/dL — ABNORMAL HIGH (ref <150)
VLDL (CALCULATED): 36 mg/dL — ABNORMAL HIGH (ref 5–35)

## 2013-12-28 LAB — HGA1C (HEMOGLOBIN A1C WITH EST AVG GLUCOSE)
ESTIMATED AVERAGE GLUCOSE: 114 mg/dL — ABNORMAL HIGH (ref 70–110)
GLYCOHEMOGLOBIN: 5.6 % (ref 4.0–6.0)
GLYCOHEMOGLOBIN: 5.6 % (ref 4.0–6.0)

## 2013-12-28 LAB — VITAMIN B12: VITAMIN B12: 657 pg/mL (ref 180–914)

## 2013-12-28 LAB — TRANSFERRIN: TRANSFERRIN: 270 mg/dL (ref 184–359)

## 2013-12-28 LAB — PHOSPHORUS: PHOSPHORUS: 3.4 mg/dL (ref 2.4–4.7)

## 2013-12-28 NOTE — Progress Notes (Signed)
PREOPERATIVE ASSESSMENT  [facility]      Date: 12/28/2013 Patient Name: Brittany SatoDenise Renee Durnell DOB: [birth year]    Surgeon/Consult Requested By: Dr. Nuala AlphaFitzer      Patient Identification:    Age: 63 y.o.  Gender: female     Type and location of Surgery: bariatric  Date of Surgery: 01/23/2014      HPI: Pt had lap band placed several years ago and has slowly put the weight back on.  Going in for revision of band.     Past Medical History:  Current Outpatient Prescriptions   Medication Sig    amitriptyline (ELAVIL) 50 mg Oral Tablet Take 1 Tab (50 mg total) by mouth Every night    Aspirin 81 mg Oral Tablet take 81 mg by mouth Once a day.    CALCIUM CARBONATE (CALCIUM 600 ORAL) take  by mouth.    Desloratadine (CLARINEX) 5 mg Oral Tablet take 5 mg by mouth Once a day.    ESOMEPRAZOLE MAGNESIUM (NEXIUM ORAL) take  by mouth.    levothyroxine (SYNTHROID) 75 mcg Oral Tablet Take 1 Tab (75 mcg total) by mouth Once a day    loratadine (CLARITIN) 10 mg Oral Tablet Take 1 Tab (10 mg total) by mouth Once a day    mometasone (NASONEX) 50 mcg/actuation Nasal Spray, Non-Aerosol 2 Sprays by Nasal route Once a day    Omega-3 Fatty Acids-Vitamin E (FISH OIL) 1,000 mg Oral Capsule take 1,000 mg by mouth Twice daily.    ondansetron (ZOFRAN) 4 mg Oral Tablet Take 1 Tab (4 mg total) by mouth Every 12 hours as needed for nausea/vomiting    phenazopyridine (PYRIDIUM) 200 mg Oral Tablet Take 1 Tab (200 mg total) by mouth Three times a day as needed for Pain    SUMAtriptan (IMITREX) 50 mg Oral Tablet Take 1 Tab (50 mg total) by mouth Once, as needed for Migraine for up to 1 dose May repeat in 2 hours in needed    Topiramate (TOPAMAX) 50 mg Oral Tablet TAKE 1 TAB (50 MG TOTAL) BY MOUTH TWICE DAILY    VITAMIN B COMPLEX NO.12-NIACIN ORAL take  by mouth.     Allergies   Allergen Reactions    Sulfa (Sulfonamides) Hives/ Urticaria    Penicillins Hives/ Urticaria     Past Medical History   Diagnosis Date    Thyroid disease        Overactive bladder     Reflux     Headache(784.0)      No past medical history pertinent negatives.          History of cardiac or respiratory problems: No  Perioperative Use of Beta-Blockers Contraindicated:  no  Past Surgical History   Procedure Laterality Date    Hx knee surgery       TKA bilateral    Hx gall bladder surgery/chole      Hx gastric bypass       lap band     No past surgical history pertinent negatives.      Family History   Problem Relation Age of Onset    Arthritis-rheumatoid Neg Hx     Arthritis-osteo Neg Hx     Asthma Neg Hx     Breast Cancer Neg Hx     Thyroid Disease Mother     Hypertension Mother     Hypertension Father     Heart Attack Father     Thyroid Disease Sister     Heart Attack Brother  Hypertension Brother     No Known Problems Maternal Grandmother     No Known Problems Maternal Grandfather     No Known Problems Paternal Grandmother     No Known Problems Paternal Grandfather     No Known Problems Son     Thyroid Disease Sister     Hypertension Sister     No Known Problems Sister     Hypertension Brother     Hypertension Brother          History   Substance Use Topics    Smoking status: Never Smoker     Smokeless tobacco: Not on file    Alcohol Use: No       ROS:  History of post-operative vomiting from anesthesia  Able to walk 2 flights of steps without rest, SOB or chest pain  Constitutional: negative for fevers, chills, fatigue, anorexia and weight loss  Eyes: negative for visual disturbance, irritation, redness and blurry vision  ENT and face: negative for hearing loss, ear drainage, nasal congestion and sore throat  Respiratory: negative for cough, sputum, asthma, wheezing or dyspnea on exertion  Cardiovascular: negative for chest pain, chest pressure/discomfort, dyspnea, palpitations and syncope  Gastrointestinal: negative for nausea, vomiting, change in bowel habits and abdominal pain  Genitourinary:negative for frequency, dysuria and  urinary incontinence  Integument/breast: negative for rash, skin lesion(s) and skin color change  Hematologic/lymphatic: negative for easy bruising, bleeding and lymphadenopathy  Musculoskeletal:negative for myalgias, arthralgias and back pain  Neurological: negative for headaches, dizziness, seizures and speech problems  Behavioral/Psych: negative for aggressive behavior, anxiety and depression  Endocrine: negative for diabetic symptoms including polyuria and polydipsia and temperature intolerance  Allergic/Immunologic: negative for urticaria and hay fever    Physical Exam:    BP 126/88 mmHg   Pulse 84   Temp(Src) 36.8 C (98.2 F) (Oral)   Resp 14   Ht 1.702 m (5\' 7" )   Wt 151.048 kg (333 lb)   BMI 52.14 kg/m2   SpO2 98%   General: Appears well in no distress  Eyes: Conjunctiva clear., Pupils equal and round, reactive to light and accomodation.   HENT:ENT without erythema or injection, mucouse membranes moist., TM's Clear.   Neck: No JVD or thyromegaly  Lungs: Clear to auscultation bilaterally.   Cardiovascular: regular rate and rhythm no murmurs  Abdomen: Soft, non-tender, Bowel sounds normal, No hepatosplenomegaly  Extremities: No cyanosis or edema  Skin: Skin warm and dry, No rashes and No lesions  Neurologic: Grossly normal  Lymphatics: No lymphadenopathy  Psychiatric: Normal    Labs/EKG/CXR/Other relevant studies:  EKG: NSR  CXR: not indicated  Other: lipids elevated, remainder of labs are normal    Recommendations:    1. Medication Changes: none  2. Additional Cardiac Testing needed: none  3. DVT Prophylaxis : standard  4. SBE Prophylaxis : not indicated  5. OTHER: none    Patient is medically cleared for surgery with above recommendations    Burna Cash, MD 12/28/2013, 11:43

## 2013-12-28 NOTE — Progress Notes (Signed)
BP 126/88 mmHg   Pulse 84   Temp(Src) 36.8 C (98.2 F) (Oral)   Resp 14   Ht 1.702 m (5\' 7" )   Wt 151.048 kg (333 lb)   BMI 52.14 kg/m2   SpO2 98%

## 2013-12-29 ENCOUNTER — Ambulatory Visit: Payer: BLUE CROSS/BLUE SHIELD | Attending: Surgery

## 2013-12-29 NOTE — Pre-Procedure Instructions (Signed)
Dr Marylynn Pearson office faxed requesting: labs, ekg, med clear, psych consult, h/p, orders and consent. Patient emailed: hibiclens instructions, preparing for surgery brochure, link to bariatric video and quiz. Patient's email: [email or web address]

## 2013-12-31 NOTE — Progress Notes (Signed)
[facility]  Progress Note    Subjective:     Brittany Bailey is a 63 y.o. female here for possible uti.  she complains of dysuria, frequency, urgency for 3 weeks.  Seen at hospital for same, put on keflex, felt a little better at first but then symptoms returned before she had even finished the course.  Has been done with this several days.  No fever or flank pain.    Review of Systems: All others negative    Objective:     Vitals: Blood pressure 142/100, pulse 103, temperature 36.5 C (97.7 F), temperature source Oral, resp. rate 18, height 1.702 m (5\' 7" ), weight 151.229 kg (333 lb 6.4 oz), SpO2 98 %.  General: appears in good health and no distress  Respiratory: clear to auscultation bilaterally.   Cardiovascular:    Heart regular rate and rhythm without murmer  Gastrointestinal: soft, non-tender and no CVA tenderness    positive for leukocytes, blood    Assessment/Plan:       ICD-10-CM    1. UTI (lower urinary tract infection) N39.0 POCT URINE DIPSTICK     ciprofloxacin HCl (CIPRO) 500 mg Oral Tablet     URINE CULTURE - [facility] ONLY     Has been off keflex long enough will culture, probably had UTI resistant to keflex    follow up prn        Other medical issues include:  Patient Active Problem List   Diagnosis    Hypothyroidism       Family History   Problem Relation Age of Onset    Arthritis-rheumatoid Neg Hx     Arthritis-osteo Neg Hx     Asthma Neg Hx     Breast Cancer Neg Hx     Thyroid Disease Mother     Hypertension Mother     Hypertension Father     Heart Attack Father     Thyroid Disease Sister     Heart Attack Brother     Hypertension Brother     No Known Problems Maternal Grandmother     No Known Problems Maternal Grandfather     No Known Problems Paternal Grandmother     No Known Problems Paternal Grandfather     No Known Problems Son     Thyroid Disease Sister     Hypertension Sister     No Known Problems Sister     Hypertension Brother     Hypertension Brother           Allergies   Allergen Reactions    Sulfa (Sulfonamides) Hives/ Urticaria    Penicillins Hives/ Urticaria       Burna CashLola Faizon Capozzi, MD 12/31/2013, [id number]

## 2014-01-02 LAB — VITAMIN D, SERUM (25 HYDROXYVITAMIN D2 AND D3 BY MS)
25 HYDROXYVITAMIN D2/D3,total: 19.7 ng/mL (ref <100)
25 HYDROXYVITAMIN D2: <4 ng/mL
25 HYDROXYVITAMIN D3: 19.7 ng/mL

## 2014-01-02 LAB — FOLIC ACID, RBC - BMC/JMC ONLY: RBC FOLATE: 496 ng/mL (ref >280)

## 2014-01-05 ENCOUNTER — Other Ambulatory Visit (INDEPENDENT_AMBULATORY_CARE_PROVIDER_SITE_OTHER): Payer: Self-pay

## 2014-01-05 MED ORDER — TOPIRAMATE 50 MG TABLET
ORAL_TABLET | ORAL | Status: DC
Start: 2014-01-05 — End: 2014-03-10

## 2014-01-09 ENCOUNTER — Other Ambulatory Visit (INDEPENDENT_AMBULATORY_CARE_PROVIDER_SITE_OTHER): Payer: Self-pay

## 2014-01-09 DIAGNOSIS — R519 Headache, unspecified: Secondary | ICD-10-CM

## 2014-01-09 DIAGNOSIS — R51 Headache: Principal | ICD-10-CM

## 2014-01-09 MED ORDER — SUMATRIPTAN 50 MG TABLET
50.0000 mg | ORAL_TABLET | Freq: Once | ORAL | Status: DC | PRN
Start: 2014-01-09 — End: 2014-07-26

## 2014-01-16 ENCOUNTER — Other Ambulatory Visit (INDEPENDENT_AMBULATORY_CARE_PROVIDER_SITE_OTHER): Payer: Self-pay | Admitting: FAMILY MEDICINE

## 2014-01-16 MED ORDER — LEVOTHYROXINE 75 MCG TABLET
75.0000 ug | ORAL_TABLET | Freq: Every day | ORAL | Status: DC
Start: 2014-01-16 — End: 2014-03-15

## 2014-01-19 NOTE — Anesthesia Preprocedure Evaluation (Addendum)
Anesthesia Evaluation    AIRWAY    Mallampati: II    TM distance: >3 FB  Neck ROM: full  Mouth Opening:full   CARDIOVASCULAR    cardiovascular exam normal, regular and normal       DENTAL    no notable dental hx     PULMONARY    pulmonary exam normal and clear to auscultation     OTHER FINDINGS    NL HCT PLT CR  EKG SR WNL    No cp sob, pt swims on occasion        PSS Anesthesia Comments: Obese; PONV; labs - WNL; EKG - SR, WNL; Medical clearance under "Media" - "Outside Record" -J.Wu        Anesthesia Plan    ASA 3     general               (Risks discussed including but not limited to:  neurological complications such as stroke,   cardiovascular complications such as heart attack,   pulmonary complications such as asthmatic attack,   intra-operative awareness, death, dental trauma, and allergic reaction.     Questions answered.     Pt understands and wishes to proceed.     Martie Lee, MD  )      intravenous induction   Detailed anesthesia plan: general endotracheal      Post Op: continuous oximetry  Post op pain management: per surgeon    informed consent obtained    Plan discussed with CRNA.    ECG reviewed  pertinent labs reviewed

## 2014-01-23 ENCOUNTER — Encounter
Admission: RE | Disposition: A | Discharge status: Home / Self Care | Payer: Self-pay | Source: Ambulatory Visit | Attending: Surgery

## 2014-01-23 ENCOUNTER — Inpatient Hospital Stay: Payer: BLUE CROSS/BLUE SHIELD | Admitting: Surgery

## 2014-01-23 ENCOUNTER — Inpatient Hospital Stay: Payer: BLUE CROSS/BLUE SHIELD | Admitting: Physician Assistant

## 2014-01-23 ENCOUNTER — Ambulatory Visit: Payer: Self-pay

## 2014-01-23 ENCOUNTER — Inpatient Hospital Stay
Admission: RE | Admit: 2014-01-23 | Discharge: 2014-01-25 | DRG: 327 | Disposition: A | Discharge status: Home / Self Care | Payer: BLUE CROSS/BLUE SHIELD | Source: Ambulatory Visit | Attending: Surgery | Admitting: Surgery

## 2014-01-23 DIAGNOSIS — K219 Gastro-esophageal reflux disease without esophagitis: Secondary | ICD-10-CM | POA: Diagnosis present

## 2014-01-23 DIAGNOSIS — E039 Hypothyroidism, unspecified: Secondary | ICD-10-CM | POA: Diagnosis present

## 2014-01-23 DIAGNOSIS — Z6841 Body Mass Index (BMI) 40.0 and over, adult: Secondary | ICD-10-CM

## 2014-01-23 DIAGNOSIS — K660 Peritoneal adhesions (postprocedural) (postinfection): Secondary | ICD-10-CM | POA: Diagnosis present

## 2014-01-23 DIAGNOSIS — G43909 Migraine, unspecified, not intractable, without status migrainosus: Secondary | ICD-10-CM | POA: Diagnosis present

## 2014-01-23 DIAGNOSIS — R1310 Dysphagia, unspecified: Secondary | ICD-10-CM | POA: Diagnosis present

## 2014-01-23 DIAGNOSIS — G4700 Insomnia, unspecified: Secondary | ICD-10-CM | POA: Diagnosis present

## 2014-01-23 DIAGNOSIS — K9509 Other complications of gastric band procedure: Principal | ICD-10-CM | POA: Diagnosis present

## 2014-01-23 HISTORY — PX: LAPAROSCOPIC, GASTRIC BANDING, ADJUSTABLE, REMOVAL: SHX4499

## 2014-01-23 HISTORY — PX: LAPAROSCOPIC, GASTRIC BYPASS: SHX4501

## 2014-01-23 HISTORY — PX: LAPAROSCOPIC, LYSIS, ADHESIONS: SHX4534

## 2014-01-23 LAB — GLUCOSE WHOLE BLOOD - POCT: Whole Blood Glucose POCT: 102 mg/dL — ABNORMAL HIGH (ref 70–100)

## 2014-01-23 SURGERY — LAPAROSCOPIC, GASTRIC BANDING, ADJUSTABLE, REMOVAL
Anesthesia: Anesthesia General | Site: Abdomen | Wound class: Clean Contaminated

## 2014-01-23 MED ORDER — GLYCOPYRROLATE 0.2 MG/ML IJ SOLN
INTRAMUSCULAR | Status: DC | PRN
Start: 2014-01-23 — End: 2014-01-23
  Administered 2014-01-23: .9 mg via INTRAVENOUS

## 2014-01-23 MED ORDER — OXYCODONE HCL 5 MG/5ML PO SOLN
10.0000 mg | ORAL | Status: DC | PRN
Start: 2014-01-23 — End: 2014-01-25
  Administered 2014-01-24 – 2014-01-25 (×8): 10 mg via ORAL
  Filled 2014-01-23 (×8): qty 10

## 2014-01-23 MED ORDER — BUPIVACAINE-EPINEPHRINE (PF) 0.25% -1:200000 IJ SOLN
INTRAMUSCULAR | Status: AC
Start: 2014-01-23 — End: ?
  Filled 2014-01-23: qty 60

## 2014-01-23 MED ORDER — ALBUTEROL SULFATE 1.25 MG/3ML IN NEBU
1.2500 mg | INHALATION_SOLUTION | RESPIRATORY_TRACT | Status: DC | PRN
Start: 2014-01-23 — End: 2014-01-25

## 2014-01-23 MED ORDER — PROCHLORPERAZINE EDISYLATE 5 MG/ML IJ SOLN
5.0000 mg | Freq: Four times a day (QID) | INTRAMUSCULAR | Status: DC | PRN
Start: 2014-01-23 — End: 2014-01-25
  Filled 2014-01-23: qty 1

## 2014-01-23 MED ORDER — HEPARIN SODIUM (PORCINE) 5000 UNIT/ML IJ SOLN
5000.0000 [IU] | Freq: Once | INTRAMUSCULAR | Status: DC
Start: 2014-01-23 — End: 2014-01-23

## 2014-01-23 MED ORDER — LACTATED RINGERS IV SOLN
Freq: Once | INTRAVENOUS | Status: AC
Start: 2014-01-23 — End: 2014-01-23
  Administered 2014-01-23: 1000 mL via INTRAVENOUS

## 2014-01-23 MED ORDER — METOPROLOL TARTRATE 25 MG PO TABS
25.0000 mg | ORAL_TABLET | Freq: Two times a day (BID) | ORAL | Status: DC
Start: 2014-01-23 — End: 2014-01-25
  Administered 2014-01-23 – 2014-01-25 (×4): 25 mg via ORAL
  Filled 2014-01-23 (×4): qty 1

## 2014-01-23 MED ORDER — ONDANSETRON HCL 4 MG/2ML IJ SOLN
8.0000 mg | Freq: Three times a day (TID) | INTRAMUSCULAR | Status: DC | PRN
Start: 2014-01-23 — End: 2014-01-25

## 2014-01-23 MED ORDER — HYDROMORPHONE HCL 1 MG/ML IJ SOLN
INTRAMUSCULAR | Status: DC | PRN
Start: 2014-01-23 — End: 2014-01-23
  Administered 2014-01-23: .6 mg via INTRAVENOUS
  Administered 2014-01-23: .4 mg via INTRAVENOUS

## 2014-01-23 MED ORDER — HYDRALAZINE HCL 20 MG/ML IJ SOLN
10.0000 mg | Freq: Four times a day (QID) | INTRAMUSCULAR | Status: DC | PRN
Start: 2014-01-23 — End: 2014-01-25

## 2014-01-23 MED ORDER — NEOSTIGMINE METHYLSULFATE 1 MG/ML IJ SOLN
INTRAMUSCULAR | Status: DC | PRN
Start: 2014-01-23 — End: 2014-01-23
  Administered 2014-01-23: 5 mg via INTRAVENOUS

## 2014-01-23 MED ORDER — PANTOPRAZOLE SODIUM 40 MG IV SOLR
40.0000 mg | Freq: Once | INTRAVENOUS | Status: AC
Start: 2014-01-23 — End: 2014-01-23
  Administered 2014-01-23: 40 mg via INTRAVENOUS

## 2014-01-23 MED ORDER — HYDROMORPHONE HCL 2 MG/ML IJ SOLN
INTRAMUSCULAR | Status: AC
Start: 2014-01-23 — End: ?
  Filled 2014-01-23: qty 1

## 2014-01-23 MED ORDER — MIDAZOLAM HCL 2 MG/2ML IJ SOLN
INTRAMUSCULAR | Status: DC | PRN
Start: 2014-01-23 — End: 2014-01-23
  Administered 2014-01-23: 2 mg via INTRAVENOUS

## 2014-01-23 MED ORDER — ACETAMINOPHEN 10 MG/ML IV SOLN
INTRAVENOUS | Status: AC
Start: 2014-01-23 — End: ?
  Filled 2014-01-23: qty 100

## 2014-01-23 MED ORDER — LEVOFLOXACIN IN D5W 500 MG/100ML IV SOLN
INTRAVENOUS | Status: AC
Start: 2014-01-23 — End: ?
  Filled 2014-01-23: qty 100

## 2014-01-23 MED ORDER — SIMETHICONE 80 MG PO CHEW
80.0000 mg | CHEWABLE_TABLET | Freq: Four times a day (QID) | ORAL | Status: DC | PRN
Start: 2014-01-23 — End: 2014-01-25

## 2014-01-23 MED ORDER — FENTANYL CITRATE 0.05 MG/ML IJ SOLN
INTRAMUSCULAR | Status: AC
Start: 2014-01-23 — End: ?
  Filled 2014-01-23: qty 5

## 2014-01-23 MED ORDER — LACTATED RINGERS IV SOLN
INTRAVENOUS | Status: DC
Start: 2014-01-23 — End: 2014-01-25

## 2014-01-23 MED ORDER — KCL IN DEXTROSE-NACL 20-5-0.45 MEQ/L-%-% IV SOLN
INTRAVENOUS | Status: DC
Start: 2014-01-23 — End: 2014-01-25

## 2014-01-23 MED ORDER — LEVOTHYROXINE SODIUM 75 MCG PO TABS
75.0000 ug | ORAL_TABLET | Freq: Every day | ORAL | Status: DC
Start: 2014-01-24 — End: 2014-01-25
  Administered 2014-01-24 – 2014-01-25 (×2): 75 ug via ORAL
  Filled 2014-01-23 (×2): qty 1

## 2014-01-23 MED ORDER — ONDANSETRON HCL 4 MG/2ML IJ SOLN
INTRAMUSCULAR | Status: AC
Start: 2014-01-23 — End: ?
  Filled 2014-01-23: qty 2

## 2014-01-23 MED ORDER — LIDOCAINE HCL 2 % IJ SOLN
INTRAMUSCULAR | Status: DC | PRN
Start: 2014-01-23 — End: 2014-01-23
  Administered 2014-01-23: 100 mg

## 2014-01-23 MED ORDER — HEPARIN SODIUM (PORCINE) 5000 UNIT/ML IJ SOLN
INTRAMUSCULAR | Status: AC
Start: 2014-01-23 — End: ?
  Filled 2014-01-23: qty 1

## 2014-01-23 MED ORDER — NEOSTIGMINE METHYLSULFATE 1 MG/ML IJ SOLN
INTRAMUSCULAR | Status: AC
Start: 2014-01-23 — End: ?
  Filled 2014-01-23: qty 10

## 2014-01-23 MED ORDER — SODIUM CHLORIDE 0.9 % IV SOLN
100.0000 mg | INTRAVENOUS | Status: DC | PRN
Start: 2014-01-23 — End: 2014-01-23
  Administered 2014-01-23: .7 ug/kg/min via INTRAVENOUS

## 2014-01-23 MED ORDER — ONDANSETRON HCL 4 MG/2ML IJ SOLN
INTRAMUSCULAR | Status: DC | PRN
Start: 2014-01-23 — End: 2014-01-23
  Administered 2014-01-23: 4 mg via INTRAVENOUS

## 2014-01-23 MED ORDER — FENTANYL CITRATE 0.05 MG/ML IJ SOLN
50.0000 ug | INTRAMUSCULAR | Status: DC | PRN
Start: 2014-01-23 — End: 2014-01-23

## 2014-01-23 MED ORDER — SODIUM CHLORIDE 0.9 % IR SOLN
Status: DC | PRN
Start: 2014-01-23 — End: 2014-01-23
  Administered 2014-01-23: 1000 mL

## 2014-01-23 MED ORDER — ACETAMINOPHEN 10 MG/ML IV SOLN
1000.0000 mg | Freq: Once | INTRAVENOUS | Status: AC
Start: 2014-01-23 — End: 2014-01-23
  Administered 2014-01-23: 1000 mg via INTRAVENOUS

## 2014-01-23 MED ORDER — SODIUM CHLORIDE 0.9 % IV SOLN
INTRAVENOUS | Status: DC
Start: 2014-01-23 — End: 2014-01-25

## 2014-01-23 MED ORDER — ONDANSETRON HCL 4 MG/2ML IJ SOLN
4.0000 mg | Freq: Once | INTRAMUSCULAR | Status: DC | PRN
Start: 2014-01-23 — End: 2014-01-23

## 2014-01-23 MED ORDER — VECURONIUM BROMIDE 10 MG IV SOLR
INTRAVENOUS | Status: AC
Start: 2014-01-23 — End: ?
  Filled 2014-01-23: qty 10

## 2014-01-23 MED ORDER — BUPIVACAINE-EPINEPHRINE (PF) 0.25% -1:200000 IJ SOLN
INTRAMUSCULAR | Status: DC | PRN
Start: 2014-01-23 — End: 2014-01-23
  Administered 2014-01-23: 60 mL via INTRAMUSCULAR

## 2014-01-23 MED ORDER — LABETALOL HCL 5 MG/ML IV SOLN
INTRAVENOUS | Status: DC | PRN
Start: 2014-01-23 — End: 2014-01-23
  Administered 2014-01-23 (×3): 10 mg via INTRAVENOUS

## 2014-01-23 MED ORDER — PANTOPRAZOLE SODIUM 40 MG PO TBEC
40.0000 mg | DELAYED_RELEASE_TABLET | Freq: Every day | ORAL | Status: DC
Start: 2014-01-24 — End: 2014-01-25
  Administered 2014-01-24 – 2014-01-25 (×2): 40 mg via ORAL
  Filled 2014-01-23 (×2): qty 1

## 2014-01-23 MED ORDER — PROPOFOL 10 MG/ML IV EMUL
INTRAVENOUS | Status: AC
Start: 2014-01-23 — End: ?
  Filled 2014-01-23: qty 20

## 2014-01-23 MED ORDER — SUMATRIPTAN SUCCINATE 50 MG PO TABS
50.0000 mg | ORAL_TABLET | ORAL | Status: DC | PRN
Start: 2014-01-23 — End: 2014-01-25
  Administered 2014-01-24: 50 mg via ORAL
  Filled 2014-01-23: qty 1

## 2014-01-23 MED ORDER — ACETAMINOPHEN 10 MG/ML IV SOLN
1000.0000 mg | Freq: Four times a day (QID) | INTRAVENOUS | Status: AC
Start: 2014-01-23 — End: 2014-01-24
  Administered 2014-01-23 – 2014-01-24 (×3): 1000 mg via INTRAVENOUS
  Filled 2014-01-23 (×3): qty 100

## 2014-01-23 MED ORDER — TOPIRAMATE 25 MG PO TABS
50.0000 mg | ORAL_TABLET | Freq: Two times a day (BID) | ORAL | Status: DC
Start: 2014-01-23 — End: 2014-01-25
  Administered 2014-01-23 – 2014-01-25 (×4): 50 mg via ORAL
  Filled 2014-01-23 (×4): qty 2

## 2014-01-23 MED ORDER — PROMETHAZINE HCL 25 MG/ML IJ SOLN
6.2500 mg | Freq: Once | INTRAMUSCULAR | Status: DC | PRN
Start: 2014-01-23 — End: 2014-01-23

## 2014-01-23 MED ORDER — SUCCINYLCHOLINE CHLORIDE 20 MG/ML IJ SOLN
INTRAMUSCULAR | Status: DC | PRN
Start: 2014-01-23 — End: 2014-01-23
  Administered 2014-01-23: 150 mg via INTRAVENOUS

## 2014-01-23 MED ORDER — GLYCOPYRROLATE 1 MG/5ML IJ SOLN
INTRAMUSCULAR | Status: AC
Start: 2014-01-23 — End: ?
  Filled 2014-01-23: qty 5

## 2014-01-23 MED ORDER — MIDAZOLAM HCL 2 MG/2ML IJ SOLN
INTRAMUSCULAR | Status: AC
Start: 2014-01-23 — End: ?
  Filled 2014-01-23: qty 2

## 2014-01-23 MED ORDER — PROPOFOL INFUSION 10 MG/ML
INTRAVENOUS | Status: DC | PRN
Start: 2014-01-23 — End: 2014-01-23
  Administered 2014-01-23: 20 mg via INTRAVENOUS
  Administered 2014-01-23: 30 mg via INTRAVENOUS
  Administered 2014-01-23: 250 mg via INTRAVENOUS

## 2014-01-23 MED ORDER — ROCURONIUM BROMIDE 50 MG/5ML IV SOLN
INTRAVENOUS | Status: DC | PRN
Start: 2014-01-23 — End: 2014-01-23
  Administered 2014-01-23: 45 mg via INTRAVENOUS
  Administered 2014-01-23: 5 mg via INTRAVENOUS

## 2014-01-23 MED ORDER — MEPERIDINE HCL 25 MG/ML IJ SOLN
12.5000 mg | INTRAMUSCULAR | Status: DC | PRN
Start: 2014-01-23 — End: 2014-01-23

## 2014-01-23 MED ORDER — AMITRIPTYLINE HCL 50 MG PO TABS
50.0000 mg | ORAL_TABLET | Freq: Every evening | ORAL | Status: DC
Start: 2014-01-23 — End: 2014-01-25
  Administered 2014-01-23 – 2014-01-24 (×2): 50 mg via ORAL
  Filled 2014-01-23 (×3): qty 1

## 2014-01-23 MED ORDER — SUCCINYLCHOLINE CHLORIDE 20 MG/ML IJ SOLN
INTRAMUSCULAR | Status: AC
Start: 2014-01-23 — End: ?
  Filled 2014-01-23: qty 10

## 2014-01-23 MED ORDER — NON FORMULARY
1000.0000 mg | Freq: Once | Status: DC
Start: 2014-01-23 — End: 2014-01-23

## 2014-01-23 MED ORDER — ROCURONIUM BROMIDE 50 MG/5ML IV SOLN
INTRAVENOUS | Status: AC
Start: 2014-01-23 — End: ?
  Filled 2014-01-23: qty 5

## 2014-01-23 MED ORDER — SODIUM CHLORIDE 0.9 % IJ SOLN
INTRAMUSCULAR | Status: AC
Start: 2014-01-23 — End: ?
  Filled 2014-01-23: qty 10

## 2014-01-23 MED ORDER — HEPARIN SODIUM (PORCINE) 5000 UNIT/ML IJ SOLN
5000.0000 [IU] | Freq: Three times a day (TID) | INTRAMUSCULAR | Status: DC
Start: 2014-01-23 — End: 2014-01-25
  Administered 2014-01-23 – 2014-01-25 (×6): 5000 [IU] via SUBCUTANEOUS
  Filled 2014-01-23 (×6): qty 1

## 2014-01-23 MED ORDER — PANTOPRAZOLE SODIUM 40 MG IV SOLR
INTRAVENOUS | Status: AC
Start: 2014-01-23 — End: ?
  Filled 2014-01-23: qty 40

## 2014-01-23 MED ORDER — DIPHENHYDRAMINE HCL 50 MG/ML IJ SOLN
12.5000 mg | Freq: Four times a day (QID) | INTRAMUSCULAR | Status: DC | PRN
Start: 2014-01-23 — End: 2014-01-25

## 2014-01-23 MED ORDER — FENTANYL CITRATE 0.05 MG/ML IJ SOLN
INTRAMUSCULAR | Status: DC | PRN
Start: 2014-01-23 — End: 2014-01-23
  Administered 2014-01-23: 100 ug via INTRAVENOUS
  Administered 2014-01-23: 150 ug via INTRAVENOUS

## 2014-01-23 MED ORDER — HEPARIN SODIUM (PORCINE) 5000 UNIT/ML IJ SOLN
5000.0000 [IU] | Freq: Once | INTRAMUSCULAR | Status: AC
Start: 2014-01-23 — End: 2014-01-23
  Administered 2014-01-23: 5000 [IU] via SUBCUTANEOUS

## 2014-01-23 MED ORDER — LEVOFLOXACIN IN D5W 500 MG/100ML IV SOLN
500.0000 mg | Freq: Once | INTRAVENOUS | Status: AC
Start: 2014-01-23 — End: 2014-01-23
  Administered 2014-01-23: 500 mg via INTRAVENOUS

## 2014-01-23 MED ORDER — LACTATED RINGERS IV SOLN
INTRAVENOUS | Status: DC | PRN
Start: 2014-01-23 — End: 2014-01-23

## 2014-01-23 MED ORDER — HYDROMORPHONE HCL 1 MG/ML IJ SOLN
0.2000 mg | INTRAMUSCULAR | Status: DC | PRN
Start: 2014-01-23 — End: 2014-01-23

## 2014-01-23 SURGICAL SUPPLY — 76 items
ADHESIVE SKIN CLOSURE DERMABOND MINI .36 (Suture) ×1
ADHESIVE SKIN CLOSURE DERMABOND MINI .36 ML LIQUID APPLICATOR (Suture) ×1 IMPLANT
ADHESIVE SKIN CLOSURE SWIFTSET .8 ML (Skin Closure) ×2
ADHESIVE SKIN CLSR SWIFTSET .8 ML ANTIMICROBIAL BARRIER CLOGFREE VLT (Skin Closure) ×2 IMPLANT
ADHESIVE SKNCLS .8ML SWIFTSET STRL (Skin Closure) ×2
ADHESIVE SKNCLS 2 OCTYL CYNCRLT .36ML MN (Suture) ×1
APPLCATOR CHLORAPREP 26ML (Prep) ×3 IMPLANT
APPLIER IN CLP TI LG LGCLP 12MM 13.4IN (Staplers) ×1
APPLIER INTERNAL CLIP LARGE L13.4 IN (Staplers) ×1
APPLIER INTERNAL CLIP LARGE L13.4 IN TITANIUM ROTATE MULTIPLE 20 CLIP (Staplers) ×1 IMPLANT
APPLIER LIGCLP W/ROT SHFT 12MM (Staplers) ×1
BLADE SRGCLPR LF STRL PVT ADJ HD DISP (Blade)
BLADE SURGICAL CLIPPER 9660 (Blade)
BLADE SURGICAL CLIPPER PIVOT ADJUSTABLE (Blade)
BLADE SURGICAL CLIPPER PIVOT ADJUSTABLE HEAD 9661 PURPLE (Blade) IMPLANT
CLOTH BEACON TIMEOUT ORANGE (Other) ×3 IMPLANT
CORD DISP STERILE LAP (Other) ×3 IMPLANT
DERMABOND MINI (Suture) ×1
DISSECTOR CORDLSS ULTRSNC 39CM (Procedure Accessories) ×3 IMPLANT
DISSECTOR SECTO OD3/8 IN SURGICAL (Sponge) ×1
DISSECTOR SECTO OD3/8 IN SURGICAL PEANUT SPONGE (Sponge) ×1 IMPLANT
DISSECTOR SRG SCT 3/8IN PNUT SPNG (Sponge) ×1
DRAPE 3/4 SHEET FANFLD 52X76IN (Drape) ×3 IMPLANT
DRESSING TEGADERM 4X4X3/4IN (Dressing) ×1
DRESSING TRANSPARENT L4 3/4 IN X W4 IN (Dressing) ×1
DRESSING TRANSPARENT L4 3/4 IN X W4 IN POLYURETHANE ADHESIVE (Dressing) ×1 IMPLANT
DRESSING TRNS PU STD TGDRM 4.75X4IN LF (Dressing) ×1
GLOVE SURG BIOGEL INDIC SZ 6.5 (Glove) ×3 IMPLANT
GLOVE SURG BIOGEL INDIC SZ 7.5 (Glove) ×3 IMPLANT
GLOVE SURG BIOGEL PF LTX SZ7.0 (Glove) ×3 IMPLANT
GLOVE SURG BIOGEL SZ7.5 (Glove) ×3 IMPLANT
GOWN SMART IMPERVIOUS LARGE (Gown) ×3 IMPLANT
IRRIGATOR SUCTN PUMP/HANDPIECE (Suction) ×3 IMPLANT
KIT INFECTION CONTROL CUSTOM (Kits) ×3
KIT INFECTION CONTROL CUSTOM IFOH03 (Kits) ×1 IMPLANT
MATRIX SEAMGRD BIOABSRB ECLN60 (Sealant) ×5 IMPLANT
NEEDLE YALE SHRTBV 22GX1.5 DSP (Needles) ×3 IMPLANT
PACK LAPAROSCOPY ~~LOC~~ (Pack) ×3 IMPLANT
REINFORCEMENT STAPLE LINE BIOABSORBABLE (Sealant) ×5 IMPLANT
REINFORCEMENT STAPLE LINE BIOABSORBABLE GORE SEAMGUARD BLUE GOLD GREEN (Sealant) ×5 IMPLANT
REINFORCEMENT STPL LN SMGRD LF STRL (Sealant) ×5 IMPLANT
RELOAD 60 ECHELON ×18 IMPLANT
RELOAD MEDIUM THICK 60MM (Staplers) ×2
RELOAD STAPLER 3 MM 3.5 MM 4 MM L60 MM (Staplers) ×2
RELOAD STAPLER 3 MM 3.5 MM 4 MM L60 MM ENDO GIA TITANIUM MEDIUM THICK (Staplers) ×2 IMPLANT
RELOAD STPLR TI 3MM 3.5MM 4MM EGIA 60MM (Staplers) ×2
RELOAD TRI VASC MED THICK 30MM (Staplers) ×3 IMPLANT
SOL NACL .9% IRRIG 3000ML ARTH (Irrigation Solutions) ×1
SOLUTION IRR 0.9% NACL 3L ARTHMTC LF (Irrigation Solutions) ×1
SOLUTION IRRIGATION 0.9% SODIUM CHLORIDE (Irrigation Solutions) ×1 IMPLANT
SPONGE CHLRPRP TINT 26ML (Applicator) ×3 IMPLANT
SPONGE LAP 18X18IN PREWASH WHT (Sponge) ×2
SPONGE LAP XRAY 18X18IN (Sponge) ×2
SPONGE LAPAROTOMY L18 IN X W18 IN (Sponge) ×2
SPONGE LAPAROTOMY L18 IN X W18 IN PREWASH WHITE (Sponge) ×2 IMPLANT
SPONGE SECTO PEANUT DISSECT (Sponge) ×1
STAPLE RELOAD GLD ECHE 60 (Staplers) ×6 IMPLANT
STAPLE RELOAD GR ECHE 60 (Staplers) ×3 IMPLANT
STAPLER ECHELON 60LONG ARTICUL (Staplers) ×3 IMPLANT
STAPLER ENDO GIA UNIV XL (Staplers) ×3 IMPLANT
SUTURE ETHILON 2-0 FS 18IN (Suture) IMPLANT
SUTURE SILK 2-0 SH 30IN (Suture) ×24 IMPLANT
SUTURE VICRYL 0 UR6 27IN (Suture) IMPLANT
SUTURE VICRYL 4-0 PS2 27IN (Suture) ×6 IMPLANT
SYRINGE 20 ML BD LUER-LOK MEDICAL (Syringes, Needles) ×1 IMPLANT
SYRINGE 50 ML GRADUATE NONPYROGENIC DEHP (Syringes, Needles) ×1
SYRINGE 50 ML GRADUATE NONPYROGENIC DEHP FREE PVC FREE LOK MEDICAL (Syringes, Needles) ×1 IMPLANT
SYRINGE LUER LOK 50ML (Syringes, Needles) ×1
SYRINGE LUER-LOK STERILE 20CC (Syringes, Needles) ×1
SYRINGE MED 20ML LL LF STRL (Syringes, Needles) ×2
SYRINGE MED 50ML LL LF STRL GRAD N-PYRG (Syringes, Needles) ×1
TISSUE ADHESIVE SKIN (Skin Closure) ×2
TROCAR BLADELESS ENDO 12X10MM (Laparoscopy Supplies) ×15 IMPLANT
TROCAR BLADELESS ENDO 5X100MM (Laparoscopy Supplies) ×3 IMPLANT
TUBE SET HEATED INSUFLATOR (Tubing) ×3 IMPLANT
TUBE SET INSUFLT HI-FLO 40 LT (Tubes) ×3 IMPLANT

## 2014-01-23 NOTE — Transfer of Care (Signed)
Anesthesia Transfer of Care Note    Patient: Hannah Barnes    Procedures performed: Procedure(s) with comments:  LAPAROSCOPIC, GASTRIC BANDING, ADJUSTABLE, REMOVAL - LAPAROSCOPIC LAP BAND REMOVAL    LAPAROSCOPIC, GASTRIC BYPASS - LAPAROSCOPIC, GASTRIC BYPASS  LAPAROSCOPIC, LYSIS, ADHESIONS    Anesthesia type: General ETT    Patient location:Phase I PACU    Last vitals: @1540 : 143/83  72 SpO2 100%  Filed Vitals:    01/23/14 0917   BP: 149/85   Pulse: 106   Temp: 37.1 C (98.8 F)   Resp: 18   SpO2: 99%       Post pain: Patient not complaining of pain, continue current therapy      Mental Status:awake    Respiratory Function: tolerating face mask    Cardiovascular: stable    Nausea/Vomiting: patient not complaining of nausea or vomiting    Hydration Status: adequate    Post assessment: no apparent anesthetic complications and no reportable events

## 2014-01-23 NOTE — Plan of Care (Signed)
Problem: Moderate/High Fall Risk Score >/=15  Goal: Patient will remain free of falls  Outcome: Progressing  Pt is a moderate falls risk. Falls contract signed and verbalized understanding.    Problem: Pain  Goal: Patient's pain/discomfort is manageable  Outcome: Progressing  Pt' s CFG stated at 3. Managing pain on oral medications at present. Hourly rounding performed to ensure all needs are being met.    Problem: Day of Surgery-Gastric Bypass  Goal: Patient will maintain Adequate Oxygenation  Outcome: Progressing  PT placed on Massimo to monitor O2 sats.  VS-WNL.  Goal: Mobility/activity is maintained at optimum level for patient  Outcome: Progressing  Pt ambulating hallways independently able to complete one entire lap today!

## 2014-01-23 NOTE — Anesthesia Postprocedure Evaluation (Signed)
Anesthesia Post Evaluation    Patient: Hannah Barnes    Procedures performed: Procedure(s) with comments:  LAPAROSCOPIC, GASTRIC BANDING, ADJUSTABLE, REMOVAL - LAPAROSCOPIC LAP BAND REMOVAL    LAPAROSCOPIC, GASTRIC BYPASS - LAPAROSCOPIC, GASTRIC BYPASS  LAPAROSCOPIC, LYSIS, ADHESIONS    Anesthesia type: General ETT    Patient location:Phase I PACU    Last vitals:   Filed Vitals:    01/23/14 1550   BP: 142/87   Pulse: 71   Temp:    Resp: 14   SpO2: 100%       Post pain: Patient not complaining of pain, continue current therapy      Mental Status:awake    Respiratory Function: tolerating face mask    Cardiovascular: stable    Nausea/Vomiting: patient not complaining of nausea or vomiting    Hydration Status: adequate    Post assessment: no apparent anesthetic complications

## 2014-01-23 NOTE — Op Note (Signed)
OP NOTE    Date Time: 01/23/2014 3:34 PM    Patient Name:   Hannah Barnes    Date of Operation:   01/23/2014    Providers Performing:   Surgeon(s):  Leandro Berkowitz, Neal Dy, MD  Swanner, Nicola Girt, PA.  Victorino Dike was present the entire case as first assist and essential to its completion.    Assistant (s):   Circulator: Kathlen Brunswick, RN  Relief Circulator: Kathlen Brunswick, RN; Janet Berlin, RN; Glorianne Manchester, RN  Relief Scrub: Williford, Cyndi Lennert Person: Merilyn Baba E    Operative Procedure:   Procedure(s):  LAPAROSCOPIC, GASTRIC BYPASS  LAPAROSCOPIC, GASTRIC BANDING, ADJUSTABLE, REMOVAL, ALL COMPONENTS  LAPAROSCOPIC, LYSIS, ADHESIONS, 90 minutes    Preoperative Diagnosis:   Pre-Op Diagnosis Codes:  Morbid obesity, unspecified obesity type [E66.01]  Dysphagia, unspecified(787.20) [R13.10]  GERD    Postoperative Diagnosis:   Morbid obesity, unspecified obesity type [E66.01]  Dysphagia, unspecified(787.20) [R13.10]  GERD  Adhesions    Anesthesia:   General    Estimated Blood Loss:   30 mL    Implants:     Implant Name Type Inv. Item Serial No. Manufacturer Lot No. LRB No. Used Action   [employer] [facility] - [id number] Sealant [employer] SEAMGRD BIOABSRB [id number]   Dewitt Hoes [id number] N/A 5 Implanted       Drains:   Drains: no    Specimens:        SPECIMENS (last 24 hours)      Pathology Specimens       01/23/14 1500             Specimen Information    Specimen Testing Required Gross Only       Specimen ID  [id number]       Specimen Description Gastric lap band             Findings:   Upper abdominal adhesions  Negative air-insufflation leak check  Normal post-anastomotic EGD    Complications:   None    Indications:   The patient is a pleasant 63 y.o.-year-old who has been obese her entire life despite numerous diets.  Currently, her Body mass index is 49.04 kg/(m^2)., and with her comorbidities, she easily meets the NIH criteria for weight loss surgery.  Additionally, she has chronic GERD and  dysphagia because of a Lap-Band complication.  After informed consent was obtained as well as insurance preauthorization, we made plans to proceed to the OR for a laparoscopic Roux-en-Y gastric bypass with removal of her adjustable gastric band, all components.    Procedure:   The patient was brought to the OR after IV antibiotics and sub Q heparin were administered.  Plexi-pulses were placed and activated, and then general anesthesia was induced without incident.  Her legs were secured by two straps and a foot plate, and her arms were secured on arm boards, all with appropriate and ample padding.  A careful and thorough time-out was performed, and agreement was unanimous.  Her abdomen was prepped sterilely and draped.  I made a small stab 15 cm inferior and two centimeters to the left of the xiphoid, and through that incision I placed a 12 mm optical trocar into her abdomen with no complications.  The abdomen was insufflated.  I then placed 2 right sided 12 mm ports as well as two left ports, all under direct visualization with no complications.  Following that, I divided the greater omentum to the left of the  midline with the harmonic until I reached the transverse colon.  I then found the ligament of Treitz, and from the proximal jejunum, I ran out a 50 cm biliopancreatic limb.  I divided the bowel there with a 60 mm tan/white load.  I then ran a 150 cm Roux limb from the distal bowel, and I approximated the antimesenteric aspect of that 150 cm mark to the end of the biliopancreatic limb with a full length 2-0 silk.  I used that for traction while I made an antimesenteric enterotomy on both limbs there with the harmonic.  Through those enterotomies, I fired a 30 mm tan/white load to make a side-to-side small bowel anastomosis.  I closed the now-single enterotomy in 2 layers with silk and the intermesenteric space with a running silk.  I inspected the anastomosis, and I was very pleased with it.  I turned to the  superior abdomen.    I made a small stab to the left of the xiphoid, and through that I carefully placed a 5 mm locking grasper into the abdomen under direct visualization.  It was guided beneath the left lobe of the liver where it served as a liver retractor.  The patient was placed in steep reverse Trendelenberg position.      I divided the band catheter.  I opened the band capsule with cautery, divided the band, and removed it from the abdomen.  I now set about reducing the plication, which entailed an extensive lysis of adhesions in the upper abdomen involving liver, stomach, omentum, and diaphragm.  This required meticulous dissection because of the hazards, and it took 90 minutes.    I now performed a careful angle of His dissection, and then I elevated the lesser curvature 8 cm from the GE junction.  There, I carefully dissected through the gastrohepatic ligament into the lesser sac.  Then, after ensuring there was nothing in the stomach, I fired 45 mm of a green load transversely across the stomach to form the distal staple line of the pouch.  The next staple line was angled toward the angle of His, but prior to taking that and the subsequent firings, I placed a 32 Fr blunt bougie into the pouch to protect it and the esophagus during the rest of the firings.  The pouch was completed with 3 more firings.  I inspected the staple lines of the pouch and stomach remnant mm for mm, and I was pleased with them.    I now used cautery to create a small gastrotomy on the distal pouch.  I elevated the roux limb into the superior field, and about 12 cm from its end, I made an antimesenteric enterotomy.  Through that gastrotomy and enterotomy, I fired a 30 mm tan load to create an anti-colic, anti-gastric 150 cm linear stapled gastroenterostomy.  I secured the crotch of that anastomosis with a 2-0 GI silk, and I closed the now-single gastrotomy in two running layers with 2-0 silk.  I inspected the anastomosis carefully,  and I was very pleased with it.    I now leveled the patient and placed an atraumatic bowel clamp across the Roux limb just distal to the G-J anastomosis.  I submberged the pouch and anastomosis in irrigated saline.  I scrubbed out, and with the help of the anesthetist, I advanced a standard gastroscope without difficulty through the patients oropharynx and esophagus into the pouch, which was inspected.  It was of normal caliber with no evidence of bleeding.  The scope passed easily across the widely-patent anastomosis into either limb of small bowel.  I inflated air into the bowel lumen, while we observed the laparoscopic field.  We observed good inflation of the pouch and small bowel beyond, with no evidence of bubbling, even with provocative maneuvers.  We were satisfied that we had an adequate and negative leak check.  I now evacuated the bowel and withdrew the scope.  I scrubbed back in.    I now ran Pedersen's space closed with a 2-0 GI silk.  I inspected the entire abdomen carefully for organ injury and hemostasis.  There was no evidence of organ injury, and hemostasis was excellent.   I watched all of the port sites as the ports were removed one at a time.  There was no bleeding from those sites.  I deflated the abdomen.    I opened the right-side incision overlying the band port site a few cm sharply.  I used cautery to dissect down to the port capsule.  It was grasped with a Kocher and separated from the surrounding scar with cautery.  All of the Prolene suture material was removed with it.  It was removed from the wound.  Hemostasis was checked and found to be excellent.    I now approximated the dermis at all wounds with interrupted 4-0 Vicryl sutures.  The skin was sealed with Dermabond.  The patient tolerated the procedure well.  She awoke without incident, and was transported to the PACU is stable condition.  All of the counts were correct.    Signed by: Laddie Aquas, MD                                                                            [place] [facility]

## 2014-01-24 ENCOUNTER — Encounter: Payer: Self-pay | Admitting: Surgery

## 2014-01-24 LAB — CBC AND DIFFERENTIAL
Basophils Absolute Automated: 0.03 10*3/uL (ref 0.00–0.20)
Basophils Automated: 0 %
Eosinophils Absolute Automated: 0.2 10*3/uL (ref 0.00–0.70)
Eosinophils Automated: 2 %
Hematocrit: 40.6 % (ref 37.0–47.0)
Hgb: 13.5 g/dL (ref 12.0–16.0)
Immature Granulocytes Absolute: 0.03 10*3/uL
Immature Granulocytes: 0 %
Lymphocytes Absolute Automated: 0.81 10*3/uL (ref 0.50–4.40)
Lymphocytes Automated: 8 %
MCH: 29.3 pg (ref 28.0–32.0)
MCHC: 33.3 g/dL (ref 32.0–36.0)
MCV: 88.1 fL (ref 80.0–100.0)
MPV: 10.8 fL (ref 9.4–12.3)
Monocytes Absolute Automated: 0.93 10*3/uL (ref 0.00–1.20)
Monocytes: 9 %
Neutrophils Absolute: 7.94 10*3/uL (ref 1.80–8.10)
Neutrophils: 80 %
Nucleated RBC: 0 /100 WBC (ref 0–1)
Platelets: 196 10*3/uL (ref 140–400)
RBC: 4.61 10*6/uL (ref 4.20–5.40)
RDW: 14 % (ref 12–15)
WBC: 9.91 10*3/uL (ref 3.50–10.80)

## 2014-01-24 LAB — LAB USE ONLY - HISTORICAL SURGICAL PATHOLOGY

## 2014-01-24 NOTE — Consults (Signed)
MEDICINE NEW CONSULT    Date Time: 01/24/2014 2:49 PM  Patient Name: Hannah Barnes  Requesting Physician: Darlin Priestly*  Consulting Physician: Carson Myrtle, MD    Primary Care Physician: Burna Cash, MD    Reason for Consultation: *Pain control?      Assessment:     Patient Active Problem List    Diagnosis Date Noted   . Morbid obesity 01/23/2014     LAPAROSCOPIC, laparoscopic Roux-en-Y & removal of her adjustable gastric band   Nutritionist consutled  F/u H&H tomorrow  In OR IV antibiotics and sub Q heparin     Pain controlled. Cont oxycodone prn pain     BP controlled. Cont lopressor     Atelectasis prevention: incentive spirometry, teaching done    N/V on Zofran    Insomnia on Elavil    Was on ASA for primary prevention but on hold for now and have it discussed with PCP    Migraine on Topamax and PRN Imitrex    Hypothyroidism: on Synthroid, TSH pending    Eosinophilia of unknown origin, will do manual cbc in am, f/u as outpatient    HLD LDL 131    Vit B12, Folic acid and Iron panel ok prior surgery, f/u thereafter    SIRS of non-infectious origin without any organ damage as expected in peri-op setting    Recommendations:     See above      H/o Lap band with chronic GERD and dysphagia    Fitzer, Edwena Bunde*, thank you for this consultation.  We will follow the patient with you during this hospitalization.  Please contact me with any questions or issues.    History of Presenting Illness:   Hannah Barnes is a 63 y.o. female who presents to the hospital for elective lap Roux-en-Y    Past Medical History:     Past Medical History   Diagnosis Date   . Hypothyroidism      Stable on synthroid dose > 6 months   . Headache      Migraines since a child controlled on Topamax   . Hiatal hernia    . Arthritis      Bil knees s/p knee replacements   . Post-operative nausea and vomiting      Mild-Moderate   . Morbid obesity with BMI of 45.0-49.9, adult      BMI 49.8       Available old records reviewed,  including:  EPIC    Past Surgical History:     Past Surgical History   Procedure Laterality Date   . Joint replacement Bilateral 2012     Knee replacements done in [place]   . Laparoscopic gastric banding  2007   . Cholecystectomy  2008   . Knee cartilage surgery Left > 10 yrs   . Laparoscopic, gastric banding, adjustable, removal N/A 01/23/2014     Procedure: LAPAROSCOPIC, GASTRIC BANDING, ADJUSTABLE, REMOVAL;  Surgeon: Nuala Alpha, Neal Dy, MD;  Location: [facility];  Service: General;  Laterality: N/A;  LAPAROSCOPIC LAP BAND REMOVAL     . Laparoscopic, gastric bypass N/A 01/23/2014     Procedure: LAPAROSCOPIC, GASTRIC BYPASS;  Surgeon: Nuala Alpha, Neal Dy, MD;  Location: [facility];  Service: General;  Laterality: N/A;  LAPAROSCOPIC, GASTRIC BYPASS   . Laparoscopic, lysis, adhesions N/A 01/23/2014     Procedure: LAPAROSCOPIC, LYSIS, ADHESIONS;  Surgeon: Nuala Alpha Neal Dy, MD;  Location: [facility];  Service: General;  Laterality:  N/A;       Family History:   History reviewed. No pertinent family history.    Social History:     History   Smoking status   . Never Smoker    Smokeless tobacco   . Not on file     History   Alcohol Use No     History   Drug Use No       Allergies:     Allergies   Allergen Reactions   . Penicillins Hives   . Sulfa Antibiotics Hives       Medications:     Prescriptions prior to admission   Medication Sig   . amitriptyline (ELAVIL) 50 MG tablet Take 50 mg by mouth nightly.   Marland Kitchen aspirin EC 81 MG EC tablet Take 81 mg by mouth daily.   Marland Kitchen desloratadine (CLARINEX) 5 MG tablet Take 5 mg by mouth every evening.   Marland Kitchen levothyroxine (SYNTHROID, LEVOTHROID) 75 MCG tablet Take 75 mcg by mouth Once a day at 6:00am.   . Multiple Vitamin (MULTIVITAMIN) tablet Take 1 tablet by mouth daily.   . Omega-3 Fatty Acids (FISH OIL) 1000 MG Cap capsule Take 1 capsule by mouth 2 (two) times daily.   . SUMAtriptan (IMITREX) 50 MG tablet Take 50 mg by mouth every 2 (two) hours  as needed for Migraine.   . topiramate (TOPAMAX) 50 MG tablet Take 50 mg by mouth 2 (two) times daily.       Current Facility-Administered Medications   Medication Dose Route Frequency   . amitriptyline  50 mg Oral QHS   . heparin (porcine)  5,000 Units Subcutaneous Q8H   . levothyroxine  75 mcg Oral Daily at 0600   . metoprolol  25 mg Oral Q12H   . pantoprazole  40 mg Oral Daily   . topiramate  50 mg Oral Q12H SCH            Review of Systems:   All other systems were reviewed and are negative except abdominal discomfort nausea    Physical Exam:   Patient Vitals for the past 24 hrs:   BP Temp Temp src Pulse Resp SpO2   01/24/14 1143 100/64 mmHg (!) 96.6 F (35.9 C) Oral 80 16 96 %   01/24/14 0742 118/66 mmHg 98.4 F (36.9 C) Oral 80 20 97 %   01/24/14 0443 111/63 mmHg 97.9 F (36.6 C) Oral 80 18 97 %   01/24/14 0027 136/73 mmHg 98.4 F (36.9 C) Oral 74 18 97 %   01/23/14 2006 138/73 mmHg 97.2 F (36.2 C) Oral 77 18 99 %   01/23/14 1908 147/78 mmHg (!) 96.8 F (36 C) Oral 65 16 99 %   01/23/14 1813 143/78 mmHg (!) 96.8 F (36 C) Oral 63 18 98 %   01/23/14 1648 134/75 mmHg - - (!) 59 - 97 %   01/23/14 1620 150/79 mmHg - - 62 14 96 %   01/23/14 1610 152/84 mmHg - - 72 14 95 %   01/23/14 1600 141/89 mmHg - - 71 14 97 %   01/23/14 1550 142/87 mmHg - - 71 14 100 %   01/23/14 1540 143/83 mmHg - - 78 14 100 %   01/23/14 1536 (!) 130/96 mmHg 98 F (36.7 C) Temporal Art 81 14 99 %     Body mass index is 49.04 kg/(m^2).    Intake/Output Summary (Last 24 hours) at 01/24/14 1449  Last data filed at 01/24/14 1440  Gross per 24 hour   Intake   2964 ml   Output    500 ml   Net   2464 ml       General: awake, alert, oriented x 3; no acute distress.  HEENT: perrla, eomi, sclera anicteric  oropharynx clear without lesions, mucous membranes moist  Neck: supple, no lymphadenopathy, no thyromegaly, no JVD, no carotid bruits  Cardiovascular: regular rate and rhythm, no murmurs, rubs or gallops  Lungs: clear to auscultation  bilaterally, without wheezing, rhonchi, or rales  Abdomen: soft, non-tender, non-distended; no palpable masses, no hepatosplenomegaly, normoactive bowel sounds, no rebound or guarding  Extremities: no clubbing, cyanosis, or edema  Neuro: A+O x 3, cranial nerves grossly intact, strength 5/5 in upper and lower extremities, sensation intact,   Skin: no rashes or lesions noted      Labs:     Recent Labs      01/24/14   0440   WBC  9.91   HEMOGLOBIN  13.5   HEMATOCRIT  40.6   PLATELETS  196       No results for input(s): NA, K, CL, CO2, BUN, CREAT, GLU, CA, MG, PHOS in the last 72 hours.    No results for input(s): AST, ALT, ALKPHOS, PROT, ALB in the last 72 hours.    No results for input(s): PTT, PT, INR in the last 72 hours.    Imaging personally reviewed      Signed by: Carson Myrtle, MD    cc: Lamar Sprinkles, MD

## 2014-01-24 NOTE — Progress Notes (Signed)
**Note De-Identified  Obfuscation** Inpatient Bariatric Nutrition Education       Surgery Type: Band removal to RNY  Surgery Date: 01/23/14    Nutrition education done by surgeon's RD pre-operatively: Yes    Reinforced nutritional guidelines: Yes    Instructed patient on adequate fluid intake with goal of drinking 64 ounces/day: Yes    Patient verbalized understanding of diet progression: Yes    Patient has home supply of protein supplements: Yes    Patient has home supply of vitamins and minerals: Yes    Going Home Nutrition information reviewed with patient: Yes    Educational materials/handouts given to patient: Yes    Patient verbalized comprehension of bariatric nutrition guidelines to follow upon discharge: Yes    Notes: Questions answered.

## 2014-01-24 NOTE — Progress Notes (Signed)
Pt interviewed and denies any anesthestic complications.

## 2014-01-24 NOTE — Progress Notes (Signed)
Met with the patient and confirmed demographics. Educated the patient on the role of Case Management. Informed that she lives with her spouse who will be home with her after discharge. She was very functional prior to admission. At this time no discharge needs anticipated. Case Management will continue to monitor.

## 2014-01-24 NOTE — Plan of Care (Signed)
Problem: Safety  Goal: Patient will be free from injury during hospitalization  Outcome: Progressing  Bed in lowest position. Call bell within reach. Pt told to call for assistance.     Problem: Post op Day 1- Gastric Banding  Goal: Pain at Adequate Level Gastric Banding  Outcome: Progressing  Pt states pain is controlled with prn meds.   Goal: Nutritional intake is adequate  Outcome: Progressing  Pt tolerating bariatric clears  Goal: Mobility/Activity is maintained at optimum level for patient  Outcome: Progressing  Pt is ambulating in room and hallway.

## 2014-01-24 NOTE — Progress Notes (Signed)
PROGRESS NOTE    Date Time: 01/24/2014 7:48 AM  Patient Name: Hannah Barnes, Hannah Barnes      Assessment:   59F POD#1 s/p lap gastric bypass and LAPBAND removal, doing well overall, some nausea, awaiting increased tolerance of PO liquids as current intake not adequate to maintain hydration.  No evidence of anastomotic leak, DVT/PE, or hemorrhage.    Plan:   - Pain controlled. Cont oxycodone prn pain  - Satting well on RA. Encourage ambulation and IS use.  - BP controlled. Cont lopressor  - Cont clears for goal 4oz/hr.  Sip slowly. Use 1oz cups. Protonix. Zofran and Phenergan prn nausea. Simethicone prn gas pains. Counseled on overfilling.  - Adequate UOP.  Cont IVF @ 150  - SQH and SCDs for DVT ppx  - cont home meds - topamax, elavil, and synthroid    Subjective:   NAE o/n.  Pain controlled on oxycodone.   - flatus.  Some nausea, denies emesis - relief with antiemetics.  Tolerating 1-2 ounces per hour.  Ambulating in halls.    Medications:     Current Facility-Administered Medications   Medication Dose Route Frequency   . acetaminophen  1,000 mg Intravenous Q6H   . amitriptyline  50 mg Oral QHS   . heparin (porcine)  5,000 Units Subcutaneous Q8H   . levothyroxine  75 mcg Oral Daily at 0600   . metoprolol  25 mg Oral Q12H   . pantoprazole  40 mg Oral Daily   . topiramate  50 mg Oral Q12H SCH       Review of Systems:   A comprehensive review of systems was: see subjective    Physical Exam:     Filed Vitals:    01/24/14 0742   BP: 118/66   Pulse: 80   Temp: 98.4 F (36.9 C)   Resp: 20   SpO2: 97%       Intake and Output Summary (Last 24 hours) at Date Time    Intake/Output Summary (Last 24 hours) at 01/24/14 0748  Last data filed at 01/24/14 0700   Gross per 24 hour   Intake   4784 ml   Output    300 ml   Net   4484 ml       General appearance - alert, well appearing, and in no distress  Mental status - alert, oriented to person, place, and time  Chest - clear to auscultation, no wheezes, rales or rhonchi, symmetric air  entry  Heart - normal rate, regular rhythm, normal S1, S2, no murmurs, rubs, clicks or gallops  Abdomen - abd soft, ND, appropriate incisional ttp, incisions c/d/i/d, no peritoneal signs  Skin - normal coloration and turgor, no rashes, no suspicious skin lesions noted    Labs:     Results     Procedure Component Value Units Date/Time    CBC and differential [[id number]] Collected:  01/24/14 0440    Specimen Information:  Blood / Blood Updated:  01/24/14 0514     WBC 9.91 x10 3/uL      RBC 4.61 x10 6/uL      Hgb 13.5 g/dL      Hematocrit 40.9 %      MCV 88.1 fL      MCH 29.3 pg      MCHC 33.3 g/dL      RDW 14 %      Platelets 196 x10 3/uL      MPV 10.8 fL      Neutrophils 80 %  Lymphocytes Automated 8 %      Monocytes 9 %      Eosinophils Automated 2 %      Basophils Automated 0 %      Immature Granulocyte 0 %      Nucleated RBC 0 /100 WBC      Neutrophils Absolute 7.94 x10 3/uL      Abs Lymph Automated 0.81 x10 3/uL      Abs Mono Automated 0.93 x10 3/uL      Abs Eos Automated 0.20 x10 3/uL      Absolute Baso Automated 0.03 x10 3/uL      Absolute Immature Granulocyte 0.03 x10 3/uL     Glucose Whole Blood - POCT [[id number]]  (Abnormal) Collected:  01/23/14 0934     POCT - Glucose Whole blood 102 (H) mg/dL Updated:  40/98/11 9147          Recent CBC   Recent Labs      01/24/14   0440   RBC  4.61   HEMOGLOBIN  13.5   HEMATOCRIT  40.6   MCV  88.1   MCH, POC  29.3   MCHC  33.3   RDW  14   MPV  10.8       Rads:   Radiological Procedure reviewed.    Signed by: Nicola Girt Swanner

## 2014-01-25 LAB — CBC AND DIFFERENTIAL
Basophils Absolute Automated: 0.02 10*3/uL (ref 0.00–0.20)
Basophils Automated: 0 %
Eosinophils Absolute Automated: 0.54 10*3/uL (ref 0.00–0.70)
Eosinophils Automated: 5 %
Hematocrit: 38.5 % (ref 37.0–47.0)
Hgb: 13 g/dL (ref 12.0–16.0)
Immature Granulocytes Absolute: 0.04 10*3/uL
Immature Granulocytes: 0 %
Lymphocytes Absolute Automated: 1.02 10*3/uL (ref 0.50–4.40)
Lymphocytes Automated: 10 %
MCH: 29.8 pg (ref 28.0–32.0)
MCHC: 33.8 g/dL (ref 32.0–36.0)
MCV: 88.3 fL (ref 80.0–100.0)
MPV: 10.2 fL (ref 9.4–12.3)
Monocytes Absolute Automated: 0.97 10*3/uL (ref 0.00–1.20)
Monocytes: 10 %
Neutrophils Absolute: 7.66 10*3/uL (ref 1.80–8.10)
Neutrophils: 75 %
Nucleated RBC: 0 /100 WBC (ref 0–1)
Platelets: 168 10*3/uL (ref 140–400)
RBC: 4.36 10*6/uL (ref 4.20–5.40)
RDW: 14 % (ref 12–15)
WBC: 10.21 10*3/uL (ref 3.50–10.80)

## 2014-01-25 LAB — CBC WITH MANUAL DIFFERENTIAL
Band Neutrophils Absolute: 0.83 10*3/uL (ref 0.00–1.00)
Band Neutrophils: 8 %
Basophils Absolute Manual: 0 10*3/uL (ref 0.00–0.20)
Basophils Manual: 0 %
Cell Morphology: NORMAL
Eosinophils Absolute Manual: 0.52 10*3/uL (ref 0.00–0.70)
Eosinophils Manual: 5 %
Hematocrit: 37.9 % (ref 37.0–47.0)
Hgb: 12.5 g/dL (ref 12.0–16.0)
Lymphocytes Absolute Manual: 0.83 10*3/uL (ref 0.50–4.40)
Lymphocytes Manual: 8 %
MCH: 29.1 pg (ref 28.0–32.0)
MCHC: 33 g/dL (ref 32.0–36.0)
MCV: 88.3 fL (ref 80.0–100.0)
MPV: 11 fL (ref 9.4–12.3)
Monocytes Absolute: 0.72 10*3/uL (ref 0.00–1.20)
Monocytes Manual: 7 %
Neutrophils Absolute Manual: 7.43 10*3/uL (ref 1.80–8.10)
Nucleated RBC: 0 /100 WBC (ref 0–1)
Platelet Estimate: NORMAL
Platelets: 183 10*3/uL (ref 140–400)
RBC: 4.29 10*6/uL (ref 4.20–5.40)
RDW: 14 % (ref 12–15)
Segmented Neutrophils: 72 %
WBC: 10.32 10*3/uL (ref 3.50–10.80)

## 2014-01-25 LAB — COMPREHENSIVE METABOLIC PANEL
ALT: 46 U/L (ref 0–55)
AST (SGOT): 33 U/L (ref 5–34)
Albumin/Globulin Ratio: 1.4 (ref 0.9–2.2)
Albumin: 3.1 g/dL — ABNORMAL LOW (ref 3.5–5.0)
Alkaline Phosphatase: 64 U/L (ref 37–106)
Anion Gap: 6 (ref 5.0–15.0)
BUN: 9 mg/dL (ref 7–19)
Bilirubin, Total: 0.7 mg/dL (ref 0.2–1.2)
CO2: 22 mEq/L (ref 22–29)
Calcium: 8.2 mg/dL — ABNORMAL LOW (ref 8.5–10.5)
Chloride: 109 mEq/L (ref 100–111)
Creatinine: 0.9 mg/dL (ref 0.6–1.0)
Globulin: 2.2 g/dL (ref 2.0–3.6)
Glucose: 126 mg/dL — ABNORMAL HIGH (ref 70–100)
Potassium: 3.9 mEq/L (ref 3.5–5.1)
Protein, Total: 5.3 g/dL — ABNORMAL LOW (ref 6.0–8.3)
Sodium: 137 mEq/L (ref 136–145)

## 2014-01-25 LAB — GFR: EGFR: >60

## 2014-01-25 LAB — TSH: TSH: 2.25 u[IU]/mL (ref 0.35–4.94)

## 2014-01-25 MED ORDER — PANTOPRAZOLE SODIUM 40 MG PO TBEC
40.0000 mg | DELAYED_RELEASE_TABLET | Freq: Every day | ORAL | Status: DC
Start: 2014-01-25 — End: 2020-12-02

## 2014-01-25 NOTE — Progress Notes (Signed)
Asked about plan for exercise after cleared and released yes    Talked about increasing number of steps yes    Gave hand out on increasing steps yes    Gave info on Post Op class and Personal Training services yes    Additional comments: na

## 2014-01-25 NOTE — Discharge Instructions (Signed)
Follow up with Dr. Nuala Alpha [phone]    Call Dr. Nuala Alpha  -temperature of 101.5  -increase pain  -nausea vomitting    After Bariatric Surgery: The First Six Weeks  After surgery for weight loss, your body needs time to adjust. Once you're ready, you will be given nutrition and activity programs. Follow these programs as directed. The success of the surgery depends on the choices you make.  At Home  At first, you may have stomach or bowel cramping, shoulder pain, or nausea. Tell your doctor if pain or nausea is severe or doesn't improve with time. Take pain medications as prescribed for 1-2 weeks. To ease back into your daily life, you may be given guidelines like those listed below:   You may shower within 48 hours.   You may return to driving once you no longer need pain medications. This is often 3 weeks after surgery.   You may resume sex in 3 weeks.   You may return to work in 4 weeks, or as instructed.   Avoid lifting anything over 10 pounds for 6 weeks.  Becoming More Active  Activity helps you lose weight after surgery. Start easy, but try to be a little more active each day. You might try walking. Other options include chair aerobics or using a stationary bike.   Working with Biomedical engineer  Your healthcare team members can help you adjust to changes after surgery. Here's how:   Your teamoversees follow-up care after surgery. Keep all your appointments, and ask any questions you have.   Your dietitiansets up your new nutrition plan. He or she can help you plan meals you'll enjoy.   Your psychiatrist or psychologistor other mental health professional can help you adjust to change. It may help to talk to someone about your body or other issues.  Call Your Doctor if You Have:   A fever over 101F or chills   A red, bleeding, or draining incision   Frequent or persistent vomiting   Increased pain at an incision   Pain in your legs or chest   Trouble breathing   Eating During  Healing  After surgery, a special diet will help your stomach heal. At first, you will just drink sugar free  liquids. Diet- Bariatric Clear Liquids until seen by MD  Special Issues  Certain problems may occur after surgery, depending on the type of surgery you have. These problems can include:   Malnutrition.Your body may not be able to absorb all the vitamins it needs. Symptoms include fatigue, swollen ankles, or excessive hair loss. Take vitamin supplements as prescribed, for life, to help prevent this.   Dehydration.A smaller stomach means liquids must be consumed in smaller amounts. Not getting enough liquids can lead to dehydration. Symptoms include feeling "dried out" or having dark urine. Ask your healthcare team for guidelines on getting enough liquids.   Dumping Syndrome.This can occur after procedures that bypass part of the small intestine. After eating high-sugar foods, you may have weakness, cramps, nausea, diarrhea, sweats, or fainting. Avoid foods that cause these symptoms.   Lactose Intolerance.You may lose the ability to digest lactose (a sugar found in dairy products). Symptoms include cramps, bloating, and diarrhea. Avoid dairy foods (such as milk and cheese) if this happens.      [address], [address] [place], [place] [address]. All rights reserved. This information is not intended as a substitute for professional medical care. Always follow your healthcare professional's  instructions.

## 2014-01-25 NOTE — Progress Notes (Signed)
[facility], [facility]  Answering service [phone]  Progress Note    Date Time: 01/25/2014 2:39 PM  Patient Name: Hannah Barnes  Attending Physician: Darlin Priestly*      Subjective:   Patient Seen and Examined. The notes from the last 24 hours were reviewed. "I feel okay today" I want to go home    Review of Systems:    Aside from above, No headache. No Eyes, Ear, Nose, Throat problem. No chest pain, orthopnea or shortness or breath or cough, No abdominal pain, no nausea or vomiting. No pain or weakness in upper or lower extremity, no psychiatric, neurological, endocrine or hematological complaints other than above.        Physical Exam:     Filed Vitals:    01/25/14 1135   BP: 117/69   Pulse: 81   Temp: 99.7 F (37.6 C)   Resp: 16   SpO2: 96%       Intake and Output Summary (Last 24 hours) at Date Time    Intake/Output Summary (Last 24 hours) at 01/25/14 1439  Last data filed at 01/25/14 0323   Gross per 24 hour   Intake    180 ml   Output    750 ml   Net   -570 ml       General: Awake, no acute distress.  HEENT: Normocephalic. No icter or congestion  Neck: supple, no lymphadenopathy, no thyromegaly, no JVD, no carotid bruits  Cardiovascular: S1, S2 regular rhythm and rate, no murmurs, rubs or gallops  Lungs: Bilateral breath sounds, clear to auscultation bilaterally, No wheezing, rhonchi, or rales  Abdomen: soft, non-tender, non-distended; no palpable masses,  no rebound or guarding  Extremities: no clubbing, cyanosis, or edema  Neuro: Exam grossly  non focal  Skin: no rashes or lesions noted      Meds:     Scheduled Meds:  Current Facility-Administered Medications   Medication Dose Route Frequency   . amitriptyline  50 mg Oral QHS   . heparin (porcine)  5,000 Units Subcutaneous Q8H   . levothyroxine  75 mcg Oral Daily at 0600   . metoprolol  25 mg Oral Q12H   . pantoprazole  40 mg Oral Daily   . topiramate  50 mg Oral Q12H SCH     Continuous Infusions:  . sodium chloride 20  mL/hr at 01/23/14 1748   . dextrose 5 % and 0.45 % NaCl with KCl 20 mEq 150 mL/hr at 01/25/14 0610   . lactated ringers 20 mL/hr at 01/23/14 1038     PRN Meds:.albuterol, diphenhydrAMINE, hydrALAZINE, ondansetron, oxyCODONE, prochlorperazine, simethicone, SUMAtriptan  I personally reviewed all of the medications        Labs:     Results     Procedure Component Value Units Date/Time    CBC and differential [[id number]] Collected:  01/25/14 1235    Specimen Information:  Blood / Blood Updated:  01/25/14 1244     WBC 10.21 x10 3/uL      RBC 4.36 x10 6/uL      Hgb 13.0 g/dL      Hematocrit 47.4 %      MCV 88.3 fL      MCH 29.8 pg      MCHC 33.8 g/dL      RDW 14 %      Platelets 168 x10 3/uL      MPV 10.2 fL      Neutrophils 75 %  Lymphocytes Automated 10 %      Monocytes 10 %      Eosinophils Automated 5 %      Basophils Automated 0 %      Immature Granulocyte 0 %      Nucleated RBC 0 /100 WBC      Neutrophils Absolute 7.66 x10 3/uL      Abs Lymph Automated 1.02 x10 3/uL      Abs Mono Automated 0.97 x10 3/uL      Abs Eos Automated 0.54 x10 3/uL      Absolute Baso Automated 0.02 x10 3/uL      Absolute Immature Granulocyte 0.04 x10 3/uL     TSH [[id number]] Collected:  01/25/14 0504    Specimen Information:  Blood Updated:  01/25/14 0931     Thyroid Stimulating Hormone 2.25 uIU/mL     Narrative:      Start tomorrow if already done today  Eosinophilia?    Comprehensive metabolic panel [[id number]]  (Abnormal) Collected:  01/25/14 0504    Specimen Information:  Blood Updated:  01/25/14 0636     Glucose 126 (H) mg/dL      BUN 9 mg/dL      Creatinine 0.9 mg/dL      Sodium 782 mEq/L      Potassium 3.9 mEq/L      Chloride 109 mEq/L      CO2 22 mEq/L      CALCIUM 8.2 (L) mg/dL      Protein, Total 5.3 (L) g/dL      Albumin 3.1 (L) g/dL      AST (SGOT) 33 U/L      ALT 46 U/L      Alkaline Phosphatase 64 U/L      Bilirubin, Total 0.7 mg/dL      Globulin 2.2 g/dL      Albumin/Globulin Ratio 1.4      Anion Gap 6.0     Narrative:       Start tomorrow if already done today  Eosinophilia?    GFR [[id number]] Collected:  01/25/14 0504     EGFR >60.0 Updated:  01/25/14 0636    Narrative:      Start tomorrow if already done today  Eosinophilia?    CBC WITH MANUAL DIFFERENTIAL [[id number]] Collected:  01/25/14 0504     WBC 10.32 x10 3/uL Updated:  01/25/14 0603     RBC 4.29 x10 6/uL      Hgb 12.5 g/dL      Hematocrit 52.8 %      MCV 88.3 fL      MCH 29.1 pg      MCHC 33.0 g/dL      RDW 14 %      Platelets 183 x10 3/uL      MPV 11.0 fL      Segmented Neutrophils 72 %      Band Neutrophils 8 %      Lymphocytes Manual 8 %      Monocytes Manual 7 %      Eosinophils Manual 5 %      Basophils Manual 0 %      Nucleated RBC 0 /100 WBC      Abs Seg Manual 7.43 x10 3/uL      Bands Absolute 0.83 x10 3/uL      Absolute Lymph Manual 0.83 x10 3/uL      Monocytes Absolute 0.72 x10 3/uL      Absolute Eos Manual 0.52 x10 3/uL  Absolute Baso Manual 0.00 x10 3/uL      Cell Morphology: Normal      Platelet Estimate Normal     Narrative:      Start tomorrow if already done today  Eosinophilia?           Radiology Results (24 Hour)     ** No results found for the last 24 hours. **                       Assessment/ Plan     Patient Active Problem List   Diagnosis   . Morbid obesity     Morbid obesity.  Status post surgery.  Patient doing better.  Follow-up per Dr. Nuala Alpha    Hypertension, controlled.  Continue present medication    Deep vein thrombosis prophylaxis, patient is cleared medically for discharge          Signed by: Karma Ganja, MD

## 2014-01-25 NOTE — Discharge Summary -  Nursing (Signed)
Pt discharged to home. Pt and family verbalized understanding of all discharge instructions. IV taken out. Pt taken down to lobby by transport.

## 2014-01-25 NOTE — Plan of Care (Signed)
Problem: Safety  Goal: Patient will be free from injury during hospitalization  Outcome: Progressing  Bed in lowest position. Call bell within reach. Pt told to call for assistance.     Problem: Pain  Goal: Patient's pain/discomfort is manageable  Outcome: Progressing  Bed in lowest position. Call bell within reach. Pt told to call for assistance.

## 2014-01-26 NOTE — Discharge Summary (Signed)
Physician Discharge Summary     Patient ID:  Hannah Barnes  [id number]  63 y.o.  Apr 28, 1950    Admit date: 01/23/2014    Discharge date and time: 01/25/2014  4:20 PM     Admitting Physician: Molli Hazard A. Aarya Robinson, MD, FACS    Discharge Physician: Artist Pais. Blue Winther, MD, FACS    Admission Diagnoses:     Morbid obesity   GERD  Lap band complication  Adhesions    Discharge Diagnoses:    Same    Admission Condition: Good    Discharged Condition: Good    Indication for Admission: Morbid obesity -- 278.01, planned bariatric surgery.    Hospital Course: [name] underwent an uncomplicated lap Roux-en-Y gastric bypass, lysis of adhesions, and removal of gastric band, all components, on 01/23/2014, after which she was transferred to the surgical floor.  She was treated with IV fluid, PO clear liquids, and PO analgesia.  She did fairly well overnight.  I interviewed her the afternoon of POD 1, and she felt well and looked good, but her PO intake was inadequate for discharge to be considered.  I observed her overnight.    The afternoon of POD 2, she was in good condition and good spirits and had good PO intake.  Her vitals were essentially normal.  She demonstrated a good understanding of her discharge instructions during an lengthy bedside counseling session.  Given her good progress and the absence of contraindications, the patient was discharged to her home.    Consults:  None    Significant Diagnostic Studies:  CBC    Treatments: Laparoscopic Roux-en-Y gastric bypass and EGD.    Discharge Exam:  Blood pressure 117/69, pulse 81, temperature 99.7 F (37.6 C), temperature source Oral, resp. rate 16, height 1.714 m (5' 7.48"), weight 144.062 kg (317 lb 9.6 oz), SpO2 96 %.  General appearance: alert, appears stated age and cooperative   Lungs: clear to auscultation bilaterally   Heart: regular rate and rhythm, no murmur, click, rub or gallop   Abdomen: soft, appropriately tender; bowel sounds normal; no masses, no  organomegaly    Disposition: Stable, discharged to home.    Patient Instructions:        Medication List      START taking these medications          pantoprazole 40 MG tablet   Commonly known as:  PROTONIX   Take 1 tablet (40 mg total) by mouth daily.         CONTINUE taking these medications          amitriptyline 50 MG tablet   Commonly known as:  ELAVIL       desloratadine 5 MG tablet   Commonly known as:  CLARINEX       levothyroxine 75 MCG tablet   Commonly known as:  SYNTHROID, LEVOTHROID       multivitamin tablet       SUMAtriptan 50 MG tablet   Commonly known as:  IMITREX       topiramate 50 MG tablet   Commonly known as:  TOPAMAX         STOP taking these medications          aspirin EC 81 MG EC tablet       fish oil 1000 MG Caps capsule         Where to Get Your Medications     These are the prescriptions that you need to pick up.  You may get the following medications from any pharmacy   -  pantoprazole 40 MG tablet                       Call with problems:  Fever, abdominal or chest pain, vomiting, shortness of breath, wound problems, abnormal blood sugar levels or blood pressures, leg or calf swelling, questions about medications, or any other concerns.    Activity:  No heavy lifting for four weeks.  No driving for at least five days.    Diet: Follow Dr. Marylynn Pearson bariatric green sheet.    Wound Care: As directed; do not pick the glue off for at least one full week.    Follow-up with Dr. Nuala Alpha in his office in 3 weeks.    Note: 99239 level discharge. This D/C took more than 30 minutes, more than half of which was specifically devoted to counseling.    Signed:  Neal Dy Sephira Zellman  01/26/2014  1:35 PM

## 2014-02-02 ENCOUNTER — Telehealth: Payer: Self-pay

## 2014-02-02 ENCOUNTER — Ambulatory Visit (INDEPENDENT_AMBULATORY_CARE_PROVIDER_SITE_OTHER): Admitting: FAMILY MEDICINE

## 2014-02-02 ENCOUNTER — Encounter (INDEPENDENT_AMBULATORY_CARE_PROVIDER_SITE_OTHER): Payer: Self-pay | Admitting: FAMILY MEDICINE

## 2014-02-02 VITALS — BP 142/98 | HR 112 | Temp 97.0°F | Resp 16 | Ht 67.0 in | Wt 310.5 lb

## 2014-02-02 DIAGNOSIS — Z6841 Body Mass Index (BMI) 40.0 and over, adult: Secondary | ICD-10-CM

## 2014-02-02 DIAGNOSIS — R51 Headache: Secondary | ICD-10-CM

## 2014-02-02 DIAGNOSIS — R519 Headache, unspecified: Secondary | ICD-10-CM

## 2014-02-02 MED ORDER — ACETAMINOPHEN 300 MG-CODEINE 60 MG TABLET
1.00 | ORAL_TABLET | Freq: Four times a day (QID) | ORAL | Status: DC | PRN
Start: 2014-02-02 — End: 2014-07-06

## 2014-02-02 NOTE — Progress Notes (Signed)
[facility]  Acute Visit Progress Note      Subjective:     Brittany SatoDenise Renee Bailey is a 63 y.o. female who complains of headache, nasal congestion and bilateral sinus pain across forehead for 4 days. She denies a history of anosmia, trouble tasting, fever and denies a history of asthma. Patient  reports that she has never smoked. She does not have any smokeless tobacco history on file.  She had gastric bypass done about a week ago and asked her surgeon about what she could take.  He told her still nothing with aspirin, NSAIDs or anything that is a decongestant.  Her head is really bothering her and keeping her from sleeping, and she has not gotten much relief from tylenol.  She can't take mucinex as it raises her BP and has been doing a daily nettipot.    Other medical issues include:  Patient Active Problem List   Diagnosis   . Hypothyroidism       Family History   Problem Relation Age of Onset   . Arthritis-rheumatoid Neg Hx    . Arthritis-osteo Neg Hx    . Asthma Neg Hx    . Breast Cancer Neg Hx    . Thyroid Disease Mother    . Hypertension Mother    . Hypertension Father    . Heart Attack Father    . Thyroid Disease Sister    . Heart Attack Brother    . Hypertension Brother    . No Known Problems Maternal Grandmother    . No Known Problems Maternal Grandfather    . No Known Problems Paternal Grandmother    . No Known Problems Paternal Grandfather    . No Known Problems Son    . Thyroid Disease Sister    . Hypertension Sister    . No Known Problems Sister    . Hypertension Brother    . Hypertension Brother          Allergies   Allergen Reactions   . Sulfa (Sulfonamides) Hives/ Urticaria   . Penicillins Hives/ Urticaria     Outpatient Prescriptions Marked as Taking for the 02/02/14 encounter (Office Visit) with Burna CashBurke, Adriauna Campton, MD   Medication Sig   . amitriptyline (ELAVIL) 50 mg Oral Tablet Take 1 Tab (50 mg total) by mouth Every night   . Aspirin 81 mg Oral Tablet take 81 mg by mouth Once a day.   Marland Kitchen. CALCIUM  CARBONATE (CALCIUM 600 ORAL) take  by mouth.   . Desloratadine (CLARINEX) 5 mg Oral Tablet take 5 mg by mouth Once a day.   . ESOMEPRAZOLE MAGNESIUM (NEXIUM ORAL) take  by mouth.   . levothyroxine (SYNTHROID) 75 mcg Oral Tablet Take 1 Tab (75 mcg total) by mouth Once a day   . loratadine (CLARITIN) 10 mg Oral Tablet Take 1 Tab (10 mg total) by mouth Once a day   . mometasone (NASONEX) 50 mcg/actuation Nasal Spray, Non-Aerosol 2 Sprays by Nasal route Once a day   . Omega-3 Fatty Acids-Vitamin E (FISH OIL) 1,000 mg Oral Capsule take 1,000 mg by mouth Twice daily.   . ondansetron (ZOFRAN) 4 mg Oral Tablet Take 1 Tab (4 mg total) by mouth Every 12 hours as needed for nausea/vomiting   . phenazopyridine (PYRIDIUM) 200 mg Oral Tablet Take 1 Tab (200 mg total) by mouth Three times a day as needed for Pain   . SUMAtriptan (IMITREX) 50 mg Oral Tablet Take 1 Tab (50 mg total) by  mouth Once, as needed for Migraine for up to 1 dose May repeat in 2 hours in needed   . Topiramate (TOPAMAX) 50 mg Oral Tablet TAKE 1 TAB (50 MG TOTAL) BY MOUTH TWICE DAILY   . VITAMIN B COMPLEX NO.12-NIACIN ORAL take  by mouth.       Review of Systems: All others negative    Objective:     BP 142/98 mmHg  Pulse 112  Temp(Src) 36.1 C (97 F) (Oral)  Resp 16  Ht 1.702 m (5\' 7" )  Wt 140.842 kg (310 lb 8 oz)  BMI 48.62 kg/m2  SpO2 98%  General: appears mildly ill  HENT:TM's Clear. , Pharynx injected. , left-sided thick nasal discharge, both sides erythematous and edematous  Neck: no adenopathy  Lungs: clear to auscultation bilaterally.   Cardiovascular:    Heart regular rate and rhythm without murmer  Skin: Skin warm and dry, No rashes and No lesions.  Surgical sites healing well.      Assessment/Plan:       ICD-10-CM    1. Sinus headache R51 acetaminophen-codeine (TYLENOL #4) 300-60 mg Oral Tablet       Symptomatic therapy suggested: push fluids, rest and ROV prn if symptoms persist or worsen    follow up prn    Burna CashLola Nicolaos Mitrano, MD 02/02/2014, 13:57

## 2014-02-02 NOTE — Progress Notes (Signed)
BP 142/98 mmHg   Pulse 112   Temp(Src) 36.1 C (97 F) (Oral)   Resp 16   Ht 1.702 m (5\' 7" )   Wt 140.842 kg (310 lb 8 oz)   BMI 48.62 kg/m2   SpO2 98%

## 2014-02-02 NOTE — Telephone Encounter (Signed)
**Note De-Identified  Obfuscation** Not able to talk now

## 2014-03-10 ENCOUNTER — Other Ambulatory Visit (INDEPENDENT_AMBULATORY_CARE_PROVIDER_SITE_OTHER): Payer: Self-pay | Admitting: FAMILY MEDICINE

## 2014-03-10 ENCOUNTER — Telehealth (INDEPENDENT_AMBULATORY_CARE_PROVIDER_SITE_OTHER): Payer: Self-pay | Admitting: FAMILY MEDICINE

## 2014-03-10 MED ORDER — TOPIRAMATE 50 MG TABLET
ORAL_TABLET | ORAL | Status: AC
Start: 2014-03-10 — End: ?

## 2014-03-10 NOTE — Telephone Encounter (Signed)
[facility] faxed a refill request for Brittany Bailey's levothyroxine 75 mcg.  Elicia LampKim Carper, Registration Specialist

## 2014-03-11 ENCOUNTER — Ambulatory Visit (INDEPENDENT_AMBULATORY_CARE_PROVIDER_SITE_OTHER)

## 2014-03-11 ENCOUNTER — Encounter (INDEPENDENT_AMBULATORY_CARE_PROVIDER_SITE_OTHER): Payer: Self-pay

## 2014-03-11 VITALS — BP 151/104 | HR 111 | Temp 98.2°F | Ht 67.5 in | Wt 294.8 lb

## 2014-03-11 DIAGNOSIS — J329 Chronic sinusitis, unspecified: Principal | ICD-10-CM

## 2014-03-11 MED ORDER — GUAIFENESIN ER 600 MG TABLET,EXTENDED RELEASE
600.00 mg | ORAL_TABLET | Freq: Two times a day (BID) | ORAL | Status: DC | PRN
Start: 2014-03-11 — End: 2014-07-06

## 2014-03-11 MED ORDER — AZITHROMYCIN 250 MG TABLET
ORAL_TABLET | ORAL | Status: DC
Start: 2014-03-11 — End: 2014-07-06

## 2014-03-11 NOTE — Patient Instructions (Signed)

## 2014-03-11 NOTE — Progress Notes (Signed)
SUBJECTIVE:   Brittany Bailey is a 63 y.o. female who complains of sinus and nasal congestion, sore throat, nasal blockage, low grade fever, post nasal drip and productive cough for 3 days. She denies a history of wheezing and shortness of breath and denies a history of asthma.  She has used Nasonex and Netti pot, water with baking soda;     ROS: + fever, no headaches, no wheezing or shortness of breath, no weakness or difficulty with ambulation, no skin rash or lesion    History   Substance Use Topics    Smoking status: Never Smoker     Smokeless tobacco: Not on file    Alcohol Use: No     Family History   Problem Relation Age of Onset    Arthritis-rheumatoid Neg Hx     Arthritis-osteo Neg Hx     Asthma Neg Hx     Breast Cancer Neg Hx     Thyroid Disease Mother     Hypertension Mother     Hypertension Father     Heart Attack Father     Thyroid Disease Sister     Heart Attack Brother     Hypertension Brother     No Known Problems Maternal Grandmother     No Known Problems Maternal Grandfather     No Known Problems Paternal Grandmother     No Known Problems Paternal Grandfather     No Known Problems Son     Thyroid Disease Sister     Hypertension Sister     No Known Problems Sister     Hypertension Brother     Hypertension Brother          Past Medical History   Diagnosis Date    Thyroid disease     Overactive bladder     Reflux     Headache(784.0)          Patient Active Problem List   Diagnosis    Hypothyroidism       Outpatient Prescriptions Prior to Visit:  acetaminophen-codeine (TYLENOL #4) 300-60 mg Oral Tablet Take 1 Tab by mouth Every 6 hours as needed for Pain   amitriptyline (ELAVIL) 50 mg Oral Tablet Take 1 Tab (50 mg total) by mouth Every night   Aspirin 81 mg Oral Tablet take 81 mg by mouth Once a day.   CALCIUM CARBONATE (CALCIUM 600 ORAL) take  by mouth.   Desloratadine (CLARINEX) 5 mg Oral Tablet take 5 mg by mouth Once a day.   ESOMEPRAZOLE MAGNESIUM (NEXIUM ORAL) take   by mouth.   levothyroxine (SYNTHROID) 75 mcg Oral Tablet Take 1 Tab (75 mcg total) by mouth Once a day   loratadine (CLARITIN) 10 mg Oral Tablet Take 1 Tab (10 mg total) by mouth Once a day   mometasone (NASONEX) 50 mcg/actuation Nasal Spray, Non-Aerosol 2 Sprays by Nasal route Once a day   Omega-3 Fatty Acids-Vitamin E (FISH OIL) 1,000 mg Oral Capsule take 1,000 mg by mouth Twice daily.   ondansetron (ZOFRAN) 4 mg Oral Tablet Take 1 Tab (4 mg total) by mouth Every 12 hours as needed for nausea/vomiting   phenazopyridine (PYRIDIUM) 200 mg Oral Tablet Take 1 Tab (200 mg total) by mouth Three times a day as needed for Pain   SUMAtriptan (IMITREX) 50 mg Oral Tablet Take 1 Tab (50 mg total) by mouth Once, as needed for Migraine for up to 1 dose May repeat in 2 hours in needed   Topiramate (TOPAMAX)  50 mg Oral Tablet TAKE 1 TAB (50 MG TOTAL) BY MOUTH TWICE DAILY   VITAMIN B COMPLEX NO.12-NIACIN ORAL take  by mouth.     No facility-administered medications prior to visit.     OBJECTIVE:  BP 151/104 mmHg   Pulse 111   Temp(Src) 36.8 C (98.2 F) (Oral)   Ht 1.715 m (5' 7.5")   Wt 133.72 kg (294 lb 12.8 oz)   BMI 45.46 kg/m2  She appears well, vital signs are as noted.   Ears: normal TM's and external ear canals AU.    Throat and pharynx normal   Maxillary sinuses and frontal sinuses tender to palpaiton b/l  The chest  is clear, without wheezes or rales   Heart: regular rate and rhythm  Skin: Warm and dry, no rash or lesion        ASSESSMENT:     ICD-10-CM    1. Sinusitis J32.9        PLAN:  Will prescribe Abx, mucinex, continue Nasonex; Symptomatic therapy suggested: push fluids, use vaporizer or mist prn and apply heat to sinuses prn. Pt verbalized understanding and agrees with the plan of care. Advised pt to call or return to clinic prn if these symptoms worsen or fail to improve as anticipated.    Patrecia PourMenna Ismahan Lippman, MD 03/11/2014 15:42

## 2014-03-14 ENCOUNTER — Ambulatory Visit (INDEPENDENT_AMBULATORY_CARE_PROVIDER_SITE_OTHER): Admitting: PLASTIC AND RECONSTRUCTIVE SURGERY

## 2014-03-14 ENCOUNTER — Encounter (INDEPENDENT_AMBULATORY_CARE_PROVIDER_SITE_OTHER): Payer: Self-pay | Admitting: PLASTIC AND RECONSTRUCTIVE SURGERY

## 2014-03-14 VITALS — BP 138/100 | HR 130 | Temp 97.5°F | Resp 16 | Ht 67.6 in | Wt 291.4 lb

## 2014-03-14 DIAGNOSIS — J019 Acute sinusitis, unspecified: Secondary | ICD-10-CM

## 2014-03-14 DIAGNOSIS — B9689 Other specified bacterial agents as the cause of diseases classified elsewhere: Secondary | ICD-10-CM

## 2014-03-14 MED ORDER — DOXYCYCLINE HYCLATE 100 MG CAPSULE
100.00 mg | ORAL_CAPSULE | Freq: Two times a day (BID) | ORAL | Status: AC
Start: 2014-03-14 — End: 2014-03-24

## 2014-03-14 NOTE — Addendum Note (Signed)
Addended by: Narda RutherfordKNOTT, Abbye Lao on: 03/14/2014 10:11 AM     Modules accepted: Orders, Medications

## 2014-03-14 NOTE — Patient Instructions (Signed)
[facility]  [address]  [place], [place] [id number]  Phone: [phone]  Fax: [phone]               Open Mon - Sat 8:00am - 8:00pm L[place] CrockerSun Noon- 8:00pm                                                              ~ Closed Thanksgiving and Christmas Day     Attending Caregiver: Narda RutherfordSarah Knott, PA-C      Today's orders:   Orders Placed This Encounter   . doxycycline hyclate (VIBRAMYCIN) 100 mg Oral Capsule        Prescription(s) E-Rx to:  [id number] [place], [place] - [address]    ________________________________________________________________________  Short Term Disability and Family Medical Leave Act  [facility] does NOT provide assistance with any disability applications.  If you feel your medical condition requires you to be on disability, you will need to follow up with  Your primary care physician or a specialist.  We apologize for any inconvenience.    For Medication Prescribed by [facility]:  As an Urgent Care facility, our clinic does NOT offer prescription refills over the telephone.    If you need more of the medication one of our medical providers prescribed, you will  Either need to be re-evaluated by us or see your primary care physician.    ________________________________________________________________________      It is very important that we have a phone number that is the single best way to contact you in the event that we become aware of important clinical information or concerns after your discharge.  If the phone number you provided at registration is NOT this number you should inform staff and registration prior to leaving.      Your treatment and evaluation today was focused on identifying and treating potentially emergent conditions based on your presenting signs, symptoms, and history.  The resulting initial clinical impression and treatment plan is not intended to be definitive or a substitute for a full physical examination and  evaluation by your primary care provider.  If your symptoms persist, worsen, or you develop any new or concerning symptoms, you need to be evaluated.      If you received x-rays during your visit, be aware that the final and formal interpretation of those films by a radiologist may occur after your discharge.  If there is a significant discrepancy identified after your discharge, we will contact you at the telephone number provided at registration.      If you received a pelvic exam, you may have cultures pending for sexually transmitted diseases.  Positive cultures are reported to the [facility] Department of Health as required by state law.  You should be contacted if you cultures are positive.  We will not contact you if they are negative.  You did NOT receive a PAP smear (the screening test for cervical).  This specific test for women is best performed by your gynecologist or primary care provider when indicated.      If you are over 61 year old, we cannot discuss your personal health information with a parent, spouse, family member, or anyone else without your express consent.  This does  not include those who have legitimate access to your records and information to assist in your care under the provisions of HIPAA [facility](Health Insurance Portability and Accountability Act) law, or those to whom you have previously given express written consent to do so, such a legal guardian or Power of CamasAttorney.      You may have received medication that may cause you to feel drowsy and/or light headed for several hours.  You may even experience some amnesia of your stay.  You should avoid operating a motor vehicle or performing any activity requiring complete alertness or coordination until you feel fully awake (approximately 24-48 hours).  Avoid alcoholic beverages.  You may also have a dry mouth for several hours.  This is a normal side effect and will disappear as the effects of the medication wear off.      Instructions discussed with  patient upon discharge by clinical staff with all questions answered.  Please call [facility] [phone]) if any further questions.  Go immediately to the emergency department if any concern or worsening symptoms.      Narda RutherfordSarah Knott, PA-C 03/14/2014, 10:04

## 2014-03-14 NOTE — Progress Notes (Addendum)
[facility]    Brittany Bailey  Date of Service: 03/14/2014    Chief complaint:   Chief Complaint   Patient presents with    Sinus Pressure     X X 1 Wk    Nasal Congestion    Headache       SUBJECTIVE:   Brittany Bailey is a 63 y.o. female who complains of sinus pressure for the last week, as well as nasal congestion and headache. Sinus pain worse on the left than right. + yellow mucoid drainage. She was seen at [facility] 4 days ago and prescribed azithromycin. She's been taking it without relief. She's also been taking Claritin, nasacort, mucinex, and doing netti-pot. Symptoms aren't worsening just aren't improving. Patient with PCN and sulfa allergies.    Review of Systems:  No nausea, vomiting or diarrhea. + "low-grade fever of 99" at home 2 days ago. No shortness of breath or chest pain.     History   Substance Use Topics    Smoking status: Never Smoker     Smokeless tobacco: Never Used    Alcohol Use: No     Family History   Problem Relation Age of Onset    Arthritis-rheumatoid Neg Hx     Arthritis-osteo Neg Hx     Asthma Neg Hx     Breast Cancer Neg Hx     Thyroid Disease Mother     Hypertension Mother     Hypertension Father     Heart Attack Father     Thyroid Disease Sister     Heart Attack Brother     Hypertension Brother     No Known Problems Maternal Grandmother     No Known Problems Maternal Grandfather     No Known Problems Paternal Grandmother     No Known Problems Paternal Grandfather     No Known Problems Son     Thyroid Disease Sister     Hypertension Sister     No Known Problems Sister     Hypertension Brother     Hypertension Brother          Past Medical History   Diagnosis Date    Thyroid disease     Overactive bladder     Reflux     Headache(784.0)          Patient Active Problem List   Diagnosis    Hypothyroidism       Outpatient Prescriptions Prior to Visit:  acetaminophen-codeine (TYLENOL #4) 300-60 mg Oral Tablet Take 1 Tab by mouth Every 6 hours  as needed for Pain   amitriptyline (ELAVIL) 50 mg Oral Tablet Take 1 Tab (50 mg total) by mouth Every night   Aspirin 81 mg Oral Tablet take 81 mg by mouth Once a day.   azithromycin (ZITHROMAX) 250 mg Oral Tablet 2 tablets day 1, then 1 tablet daily   CALCIUM CARBONATE (CALCIUM 600 ORAL) take  by mouth.   Desloratadine (CLARINEX) 5 mg Oral Tablet take 5 mg by mouth Once a day.   ESOMEPRAZOLE MAGNESIUM (NEXIUM ORAL) take  by mouth.   guaiFENesin (MUCINEX) 600 mg Oral Tablet Sustained Release Take 1 Tab (600 mg total) by mouth Twice per day as needed   levothyroxine (SYNTHROID) 75 mcg Oral Tablet Take 1 Tab (75 mcg total) by mouth Once a day   mometasone (NASONEX) 50 mcg/actuation Nasal Spray, Non-Aerosol 2 Sprays by Nasal route Once a day   Omega-3 Fatty Acids-Vitamin E (FISH  OIL) 1,000 mg Oral Capsule take 1,000 mg by mouth Twice daily.   ondansetron (ZOFRAN) 4 mg Oral Tablet Take 1 Tab (4 mg total) by mouth Every 12 hours as needed for nausea/vomiting   SUMAtriptan (IMITREX) 50 mg Oral Tablet Take 1 Tab (50 mg total) by mouth Once, as needed for Migraine for up to 1 dose May repeat in 2 hours in needed   Topiramate (TOPAMAX) 50 mg Oral Tablet TAKE 1 TAB (50 MG TOTAL) BY MOUTH TWICE DAILY   VITAMIN B COMPLEX NO.12-NIACIN ORAL take  by mouth.   loratadine (CLARITIN) 10 mg Oral Tablet Take 1 Tab (10 mg total) by mouth Once a day   phenazopyridine (PYRIDIUM) 200 mg Oral Tablet Take 1 Tab (200 mg total) by mouth Three times a day as needed for Pain     No facility-administered medications prior to visit.     OBJECTIVE:  BP 138/100 mmHg   Pulse 130   Temp(Src) 36.4 C (97.5 F) (Oral)   Resp 16   Ht 1.717 m (5' 7.6")   Wt 132.178 kg (291 lb 6.4 oz)   BMI 44.84 kg/m2   SpO2 97%  She appears well, vital signs are as noted.   Ears: normal TM's and external ear canals AU.    Throat and pharynx pink, moist.   Neck supple. No adenopathy in the neck. Nose erythematous with yellow mucous present. Maxillary sinuses TTP, L>R      The chest is clear, without wheezes or rales   Heart: regular rate and rhythm  Skin: Warm and dry, no rash or lesion      ASSESSMENT:     ICD-10-CM    1. Acute bacterial rhinosinusitis J01.90 doxycycline hyclate (VIBRAMYCIN) 100 mg Oral Capsule       PLAN:  Abx as above. Finish full rx. Advised to watch for diarrhea (c. Diff), rash, SOB, edema. Cont nasal rinse, nasal steroid, mucinex. Discussed good handwashing, may use tylenol for fever or pain Q4-6hrs.  Symptomatic therapy suggested: push fluids, rest, apply heat to sinuses prn and ROV prn if symptoms persist or worsen. Pt verbalized understanding and agrees with the plan of care. Advised pt to call or return to clinic prn if these symptoms worsen or fail to improve as anticipated.    Advised not to take her vitamins while on Doxy. Can restart the day after she finished her abx rx.    Follow up with PCP regarding HTN and elevated pulse.    Narda RutherfordSarah Knott, PA-C 03/14/2014 10:10  Supervising physician today: Patrecia PourMenna Jennell Janosik, MD

## 2014-03-14 NOTE — Progress Notes (Signed)
BP 138/100 mmHg  Pulse 130  Temp(Src) 36.4 C (97.5 F) (Oral)  Resp 16  Ht 1.717 m (5' 7.6")  Wt 132.178 kg (291 lb 6.4 oz)  BMI 44.84 kg/m2  SpO2 97%  Brittany Bailey, RTR  03/14/2014, 09:54

## 2014-03-15 MED ORDER — LEVOTHYROXINE 75 MCG TABLET
75.0000 ug | ORAL_TABLET | Freq: Every day | ORAL | Status: AC
Start: 2014-03-15 — End: ?

## 2014-05-05 ENCOUNTER — Telehealth (INDEPENDENT_AMBULATORY_CARE_PROVIDER_SITE_OTHER): Payer: Self-pay | Admitting: FAMILY MEDICINE

## 2014-05-05 MED ORDER — AMITRIPTYLINE 50 MG TABLET
50.0000 mg | ORAL_TABLET | Freq: Every evening | ORAL | Status: DC
Start: 2014-05-05 — End: 2014-06-01

## 2014-05-05 NOTE — Telephone Encounter (Signed)
Ms Brittany Bailey is requesting a refill on her amitriptyline 50 mg. [place] and would like to know if we can [facility] the [facility]. The number to the pharmacy is [phone].  Elicia LampKim Carper, Registration Specialist

## 2014-05-05 NOTE — Telephone Encounter (Signed)
Rx printed.  Please call pharmacy to call it in.

## 2014-05-05 NOTE — Telephone Encounter (Signed)
[facility] [place] Phone number [phone] SQ

## 2014-05-24 ENCOUNTER — Encounter (INDEPENDENT_AMBULATORY_CARE_PROVIDER_SITE_OTHER): Payer: Self-pay | Admitting: Family Medicine

## 2014-05-24 ENCOUNTER — Ambulatory Visit (INDEPENDENT_AMBULATORY_CARE_PROVIDER_SITE_OTHER): Admitting: Family Medicine

## 2014-05-24 ENCOUNTER — Other Ambulatory Visit (HOSPITAL_BASED_OUTPATIENT_CLINIC_OR_DEPARTMENT_OTHER)
Admission: RE | Admit: 2014-05-24 | Discharge: 2014-05-24 | Disposition: A | Discharge status: Home / Self Care | Attending: Family Medicine | Admitting: Family Medicine

## 2014-05-24 VITALS — BP 126/88 | HR 98 | Temp 96.7°F | Resp 14 | Ht 67.0 in | Wt 268.6 lb

## 2014-05-24 DIAGNOSIS — L659 Nonscarring hair loss, unspecified: Secondary | ICD-10-CM | POA: Insufficient documentation

## 2014-05-24 DIAGNOSIS — E039 Hypothyroidism, unspecified: Principal | ICD-10-CM

## 2014-05-24 DIAGNOSIS — Z6841 Body Mass Index (BMI) 40.0 and over, adult: Secondary | ICD-10-CM

## 2014-05-24 DIAGNOSIS — Z9884 Bariatric surgery status: Secondary | ICD-10-CM | POA: Insufficient documentation

## 2014-05-24 DIAGNOSIS — L219 Seborrheic dermatitis, unspecified: Secondary | ICD-10-CM

## 2014-05-24 DIAGNOSIS — L218 Other seborrheic dermatitis: Secondary | ICD-10-CM

## 2014-05-24 LAB — COMPREHENSIVE METABOLIC PROFILE - BMC/JMC ONLY
ALBUMIN/GLOBULIN RATIO: 1.9
ALBUMIN: 4.2 g/dL (ref 3.2–5.0)
ALKALINE PHOSPHATASE: 92 IU/L (ref 35–120)
ALT (SGPT): 33 IU/L (ref 0–55)
ANION GAP: 7 mmol/L (ref 3–11)
AST (SGOT): 27 IU/L (ref 0–45)
BILIRUBIN, TOTAL: 0.7 mg/dL (ref 0.0–1.3)
BUN: 15 mg/dL (ref 6–22)
CALCIUM: 9.6 mg/dL (ref 8.5–10.5)
CARBON DIOXIDE: 27 mmol/L (ref 22–32)
CHLORIDE: 109 mmol/L (ref 101–111)
CREATININE: 0.79 mg/dL (ref 0.53–1.00)
ESTIMATED GLOMERULAR FILTRATION RATE: >60 mL/min (ref >60)
GLUCOSE: 96 mg/dL (ref 70–110)
POTASSIUM: 3.6 mmol/L (ref 3.5–5.0)
SODIUM: 143 mmol/L (ref 136–145)
TOTAL PROTEIN: 6.4 g/dL (ref 6.0–8.0)

## 2014-05-24 LAB — CBC
BASOPHIL #: 0.1 10*3/uL (ref 0.0–0.1)
BASOPHILS %: 1.2 % (ref 0.0–2.5)
EOSINOPHIL #: 0.2 K/uL (ref 0.0–0.5)
EOSINOPHIL %: 5.2 % (ref 0.0–5.2)
HCT: 43.4 % (ref 36.0–45.0)
HGB: 14.4 g/dL (ref 12.0–15.5)
HGB: 14.4 g/dL (ref 12.0–15.5)
LYMPHOCYTE #: 1.6 10*3/uL (ref 0.7–3.2)
LYMPHOCYTE %: 33.4 % (ref 15.0–43.0)
MCH: 28.4 pg (ref 28.3–34.3)
MCHC: 33.2 g/dL (ref 32.0–36.0)
MCV: 85.7 fL (ref 82.0–97.0)
MONOCYTE #: 0.5 10*3/uL (ref 0.2–0.9)
MONOCYTE %: 9.9 % (ref 4.8–12.0)
MPV: 10.2 fL (ref 7.4–10.5)
NRBC ABSOLUTE: 0 10*3/uL (ref 0–0.02)
NRBC: 0 /100 WBC (ref 0–0.3)
PLATELET COUNT: 192 K/uL (ref 150–450)
PMN #: 2.3 K/uL (ref 1.5–6.5)
PMN %: 50.3 % (ref 43.0–76.0)
RBC: 5.07 M/uL (ref 4.00–5.10)
RDW: 13.9 % (ref 10.5–14.5)
WBC: 4.6 10*3/uL (ref 4.0–11.0)
WBC: 4.6 K/uL (ref 4.0–11.0)

## 2014-05-24 LAB — TRANSFERRIN: TRANSFERRIN: 240 mg/dL (ref 184–359)

## 2014-05-24 LAB — HGA1C (HEMOGLOBIN A1C WITH EST AVG GLUCOSE)
ESTIMATED AVERAGE GLUCOSE: 114 mg/dL — ABNORMAL HIGH (ref 70–110)
GLYCOHEMOGLOBIN: 5.6 % (ref 4.0–6.0)

## 2014-05-24 LAB — LIPID PANEL
CHOL/HDL RATIO: 2.9
CHOLESTEROL: 177 mg/dL (ref 120–199)
HDL-CHOLESTEROL: 61 mg/dL (ref >39)
LDL (CALCULATED): 90 mg/dL (ref <130)
TRIGLYCERIDES: 128 mg/dL — AB (ref <150)
VLDL (CALCULATED): 26 mg/dL (ref 5–35)

## 2014-05-24 LAB — VITAMIN B12: VITAMIN B12: 633 pg/mL (ref 180–914)

## 2014-05-24 LAB — TSH CASCADE,3RD GEN WITH REFLEX TO FT4
TSH CASCADE: 1.482 uIU/mL — AB (ref 0.340–5.600)
TSH CASCADE: 1.482 u[IU]/mL — AB (ref 0.340–5.600)

## 2014-05-24 LAB — PHOSPHORUS: PHOSPHORUS: 3.4 mg/dL (ref 2.4–4.7)

## 2014-05-24 LAB — FOLATE: FOLATE: 16.8 ng/mL (ref >4.5)

## 2014-05-24 LAB — IRON: IRON: 96 ug/dL (ref 35–145)

## 2014-05-24 NOTE — Progress Notes (Signed)
Brittany Bailey  Date of Service: 05/24/2014    Chief complaint:   Chief Complaint   Patient presents with    Hair Loss    Blood Work     Subjective  64 y.o. female here for hair loss x 2-4 weeks. Hair falls off when brushing; no patches on scalp just generalized shedding. She has h/o Hypothyroidism on Synthroid and is s/p gastric bypass surgery in Oct 2015. Denies any fever/chills/temp intolerance.  She would also like blood work ordered per recommendations from her Teacher, English as a foreign languagebariatric surgeon.    Objective  Vitals: BP 126/88 mmHg   Pulse 98   Temp(Src) 35.9 C (96.7 F) (Oral)   Resp 14   Ht 1.702 m (5\' 7" )   Wt 121.836 kg (268 lb 9.6 oz)   BMI 42.06 kg/m2   SpO2 99%  General: no distress  HENT:Mouth mucous membranes moist.   Skin: Skin warm and dry and scaly dry scalp with thin hair; no patches of hair loss noted but hair falls of easily when I move it.  Psychiatric: affect normal    Assessment/Plan  1. Hypothyroidism, unspecified hypothyroidism type    2. S/P bariatric surgery    3. Hair loss    4. Seborrheic dermatitis of scalp      -Check labs as below due to recent bariatric surgery and hypothyroidism; she may be losing hair as a result of thyroid problem or lack of other nutrients. Will manage accordingly.   -Try selsum blue shampoo for Seborrheic dermatitis of scalp.    -RTC PRN    Orders Placed This Encounter    CBC    COMPREHENSIVE METABOLIC PROFILE - [facility] ONLY    MAGNESIUM    PHOSPHORUS    LIPID PANEL    IRON - [facility] ONLY    TRANSFERRIN    FERRITIN    HGA1C (HEMOGLOBIN A1C WITH EST AVG GLUCOSE)    VITAMIN B12    VITAMIN D, SERUM (25 HYDROXYVITAMIN D2 AND D3 BY MS)    FOLATE    TSH CASCADE,3RD GEN WITH REFLEX TO FT4 - [facility] ONLY       Roxanne GatesEsther K Sander Remedios, MD

## 2014-05-24 NOTE — Progress Notes (Signed)
Pt tolerated venipuncture to the RAC without complication by N. VanDyne MAS. SQ

## 2014-05-24 NOTE — Progress Notes (Signed)
BP 126/88 mmHg  Pulse 98  Temp(Src) 35.9 C (96.7 F) (Oral)  Resp 14  Ht 1.702 m (5\' 7" )  Wt 121.836 kg (268 lb 9.6 oz)  BMI 42.06 kg/m2  SpO2 99%

## 2014-05-29 ENCOUNTER — Other Ambulatory Visit (INDEPENDENT_AMBULATORY_CARE_PROVIDER_SITE_OTHER): Payer: Self-pay | Admitting: Family Medicine

## 2014-05-29 ENCOUNTER — Telehealth (INDEPENDENT_AMBULATORY_CARE_PROVIDER_SITE_OTHER): Payer: Self-pay | Admitting: FAMILY MEDICINE

## 2014-05-29 DIAGNOSIS — L219 Seborrheic dermatitis, unspecified: Secondary | ICD-10-CM

## 2014-05-29 DIAGNOSIS — L659 Nonscarring hair loss, unspecified: Secondary | ICD-10-CM

## 2014-05-29 LAB — VITAMIN D, SERUM (25 HYDROXYVITAMIN D2 AND D3 BY MS)
25 HYDROXYVITAMIN D2/D3,total: 31.6 ng/mL (ref <100)
25 HYDROXYVITAMIN D2: <4 ng/mL
25 HYDROXYVITAMIN D3: 31.6 ng/mL

## 2014-05-29 NOTE — Telephone Encounter (Signed)
Pt aware. KM

## 2014-05-29 NOTE — Telephone Encounter (Signed)
Brittany Bailey called to check the status of her lab results. She would like a return call to [phone].  Kim Carper, Registration Specialist

## 2014-05-30 ENCOUNTER — Ambulatory Visit (INDEPENDENT_AMBULATORY_CARE_PROVIDER_SITE_OTHER)

## 2014-05-30 DIAGNOSIS — N390 Urinary tract infection, site not specified: Secondary | ICD-10-CM

## 2014-05-30 NOTE — Progress Notes (Signed)
Pt came in today for a repeat UA after round of ABX to treat UTI. SQ

## 2014-05-30 NOTE — Progress Notes (Signed)
05/30/14 1400   Urine   Time collected 1408   Glucose Negative   Bilirubin Negative   Ketones Negative   Specific Gravity 1.020   Blood (urine) Negative   pH 7.0   Protein Negative   Urobilinogen Normal    Nitrite Negative   Leukocytes Negative   Initials SQ/NV

## 2014-06-01 ENCOUNTER — Other Ambulatory Visit (INDEPENDENT_AMBULATORY_CARE_PROVIDER_SITE_OTHER): Payer: Self-pay

## 2014-06-05 MED ORDER — AMITRIPTYLINE 50 MG TABLET
50.0000 mg | ORAL_TABLET | Freq: Every evening | ORAL | Status: AC
Start: 2014-06-05 — End: ?

## 2014-06-05 NOTE — Telephone Encounter (Signed)
Sent Rx request on 06/01/14 to Dr. Roney MarionMwilaria. Pt called to find out status of request. Medication has not been sent to pharmacy yet and pt is out of medication. Dr. Roney MarionMwilaria will not be in the office until 06/14/2014.

## 2014-06-12 ENCOUNTER — Encounter (INDEPENDENT_AMBULATORY_CARE_PROVIDER_SITE_OTHER): Payer: Self-pay | Admitting: FAMILY MEDICINE

## 2014-06-15 ENCOUNTER — Other Ambulatory Visit (INDEPENDENT_AMBULATORY_CARE_PROVIDER_SITE_OTHER): Payer: Self-pay | Admitting: FAMILY MEDICINE

## 2014-06-15 MED ORDER — DESLORATADINE 5 MG TABLET
5.0000 mg | ORAL_TABLET | Freq: Every day | ORAL | Status: DC
Start: 2014-06-15 — End: 2014-09-19

## 2014-07-06 ENCOUNTER — Other Ambulatory Visit (HOSPITAL_BASED_OUTPATIENT_CLINIC_OR_DEPARTMENT_OTHER): Admission: RE | Admit: 2014-07-06 | Discharge: 2014-07-06 | Disposition: A | Discharge status: Home / Self Care

## 2014-07-06 ENCOUNTER — Encounter (INDEPENDENT_AMBULATORY_CARE_PROVIDER_SITE_OTHER): Payer: Self-pay

## 2014-07-06 ENCOUNTER — Ambulatory Visit (INDEPENDENT_AMBULATORY_CARE_PROVIDER_SITE_OTHER)

## 2014-07-06 VITALS — BP 142/87 | HR 93 | Temp 98.5°F | Resp 16 | Ht 67.8 in | Wt 261.8 lb

## 2014-07-06 DIAGNOSIS — N390 Urinary tract infection, site not specified: Principal | ICD-10-CM

## 2014-07-06 DIAGNOSIS — R350 Frequency of micturition: Secondary | ICD-10-CM

## 2014-07-06 DIAGNOSIS — R3915 Urgency of urination: Secondary | ICD-10-CM

## 2014-07-06 DIAGNOSIS — R109 Unspecified abdominal pain: Secondary | ICD-10-CM

## 2014-07-06 MED ORDER — PHENAZOPYRIDINE 200 MG TABLET
200.00 mg | ORAL_TABLET | Freq: Three times a day (TID) | ORAL | Status: DC | PRN
Start: 2014-07-06 — End: 2015-05-23

## 2014-07-06 MED ORDER — LEVOFLOXACIN 500 MG TABLET
500.0000 mg | ORAL_TABLET | Freq: Every day | ORAL | Status: AC
Start: 2014-07-06 — End: 2014-07-16

## 2014-07-06 NOTE — Patient Instructions (Signed)

## 2014-07-06 NOTE — Progress Notes (Addendum)
Patient name .Brittany Satoenise Renee Pelc  Date of birth:Aug 21, 1950    Date of service:07/06/2014    SUBJECTIVE: Brittany Bailey is a 64 y.o. female who complains of (1) urinary frequency, urgency and dysuria x 4 days associated with abdominal pain. Denies flank pain. Pt reports she has already went through menopause. She also reports she felt the sx coming on a few days ago and figured it was a uti. States she had been feeling an excessive amount of bladder pressure.    (2) Pt is also experiencing sinus pressure, HA and nasal congestion x 3 days.  Pt mentions she has had small amount of drainage from eyes and ears. Also mentions her ears have been popping more frequently. Hx of seasonal allergies.    ROS: + urinary frequency + urgency +dysuria  + abdominal pain + sinus pressure + HA + nasal congestion, No flank pain     Past Medical History  Current Outpatient Prescriptions   Medication Sig    amitriptyline (ELAVIL) 50 mg Oral Tablet Take 1 Tab (50 mg total) by mouth Every night    Aspirin 81 mg Oral Tablet take 81 mg by mouth Once a day.    CALCIUM CARBONATE (CALCIUM 600 ORAL) take  by mouth.    Desloratadine (CLARINEX) 5 mg Oral Tablet Take 1 Tab (5 mg total) by mouth Once a day    ESOMEPRAZOLE MAGNESIUM (NEXIUM ORAL) take  by mouth.    levofloxacin (LEVAQUIN) 500 mg Oral Tablet Take 1 Tab (500 mg total) by mouth Once a day for 10 days    levothyroxine (SYNTHROID) 75 mcg Oral Tablet Take 1 Tab (75 mcg total) by mouth Once a day    mometasone (NASONEX) 50 mcg/actuation Nasal Spray, Non-Aerosol 2 Sprays by Nasal route Once a day    Omega-3 Fatty Acids-Vitamin E (FISH OIL) 1,000 mg Oral Capsule take 1,000 mg by mouth Twice daily.    ondansetron (ZOFRAN) 4 mg Oral Tablet Take 1 Tab (4 mg total) by mouth Every 12 hours as needed for nausea/vomiting (Patient not taking: Reported on 05/24/2014)    phenazopyridine (PYRIDIUM) 200 mg Oral Tablet Take 1 Tab (200 mg total) by mouth Three times a day as needed for Pain       SUMAtriptan (IMITREX) 50 mg Oral Tablet Take 1 Tab (50 mg total) by mouth Once, as needed for Migraine for up to 1 dose May repeat in 2 hours in needed    Topiramate (TOPAMAX) 50 mg Oral Tablet TAKE 1 TAB (50 MG TOTAL) BY MOUTH TWICE DAILY    VITAMIN B COMPLEX NO.12-NIACIN ORAL take  by mouth.     Allergies   Allergen Reactions    Sulfa (Sulfonamides) Hives/ Urticaria    Penicillins Hives/ Urticaria     Past Medical History   Diagnosis Date    Thyroid disease     Overactive bladder     Reflux     Headache(784.0)      Past Surgical History   Procedure Laterality Date    Hx knee surgery       TKA bilateral    Hx gall bladder surgery/chole      Hx gastric bypass       lap band    Hx knee replacment       Family History   Problem Relation Age of Onset    Arthritis-rheumatoid Neg Hx     Arthritis-osteo Neg Hx     Asthma Neg Hx     Breast  Cancer Neg Hx     Thyroid Disease Mother     Hypertension Mother     Hypertension Father     Heart Attack Father     Thyroid Disease Sister     Heart Attack Brother     Hypertension Brother     No Known Problems Maternal Grandmother     No Known Problems Maternal Grandfather     No Known Problems Paternal Grandmother     No Known Problems Paternal Grandfather     No Known Problems Son     Thyroid Disease Sister     Hypertension Sister     No Known Problems Sister     Hypertension Brother     Hypertension Brother      History     Social History    Marital Status: Married     Spouse Name: N/A    Number of Children: N/A    Years of Education: N/A     Social History Main Topics    Smoking status: Never Smoker     Smokeless tobacco: Never Used    Alcohol Use: No    Drug Use: No    Sexual Activity:     Partners: Male     Other Topics Concern    Not on file     Social History Narrative     OBJECTIVE:   BP 142/87 mmHg   Pulse 93   Temp(Src) 36.9 C (98.5 F) (Oral)   Resp 16   Ht 1.722 m (5' 7.8")   Wt 118.752 kg (261 lb 12.8 oz)   BMI 40.05 kg/m2    SpO2 99%  Appears well, in no apparent distress.   HEENT: Eyes non injected, non icteric; maxillary sinuses TTP b/l  Neck: FROM, supple.   Heart: RRR, no murmur.   Lungs: CTAB, no wheezes.   Abdomen is soft with TTP in suprapubic area, no guarding, no mass, no rebound or organomegaly. No CVA tenderness or inguinal adenopathy noted.    ASSESSMENT:    ICD-10-CM    1. UTI (urinary tract infection) N39.0 POCT URINE DIPSTICK   2. Urinary urgency R39.15 POCT URINE DIPSTICK   3. Abdominal pressure R10.9 POCT URINE DIPSTICK   4.      Sinusitis  Urine Results:  QC Urine: YES  Time collected: 1133  Glucose: Negative  Bilirubin: (!) Small  Ketones: Trace (5 mg/dl)  Specific Gravity: 1.610  Blood (urine): Moderate (2+)  pH: 5.5  Protein: (!) 1+ ( /dL)  Urobilinogen: Normal   Nitrite: Negative  Leukocytes: (!) Small  Initials: CRS      PLAN:   Orders Placed This Encounter    POCT URINE DIPSTICK    levofloxacin (LEVAQUIN) 500 mg Oral Tablet    phenazopyridine (PYRIDIUM) 200 mg Oral Tablet   Plan:  Rx Levaquin to treat both UTI and sinusitis;  and pyridium for bladder spasm PRN.  Urine sent for culture and sensitivity.  Also push fluids.  Call or return to clinic prn if these symptoms worsen or fail to improve as anticipated.    I am scribing for, and in the presence of, Dr. Carmela Hurt for services provided on 07/06/2014.  [place], SCRIBE   I personally performed the services described in this documentation, as scribed  in my presence, and it is both accurate  and complete.    Patrecia Pour, MD

## 2014-07-06 NOTE — Progress Notes (Signed)
BP 142/87 mmHg   Pulse 93   Temp(Src) 36.9 C (98.5 F) (Oral)   Resp 16   Ht 1.722 m (5' 7.8")   Wt 118.752 kg (261 lb 12.8 oz)   BMI 40.05 kg/m2   SpO2 99%  Marcy Sirenharles Arlando Leisinger, RTR  07/06/2014, 11:31

## 2014-07-06 NOTE — Progress Notes (Signed)
07/06/14 1100   Urine   QC Urine YES   Time collected 1133   Glucose Negative   Bilirubin (!) 1+   Ketones Trace (5 mg/dl)   Specific Gravity 9.5181.025   Blood (urine) Moderate (2+)   pH 5.5   Protein (!) 1+ (30mg /dL)   Urobilinogen Normal    Nitrite Negative   Leukocytes (!) 1+   Initials CRS

## 2014-07-07 ENCOUNTER — Encounter (INDEPENDENT_AMBULATORY_CARE_PROVIDER_SITE_OTHER): Payer: Self-pay

## 2014-07-07 NOTE — Progress Notes (Signed)
Patient seen in Urgent Care yesterday. Attempted to contact for a follow-up, no answer at this time.  Taisei Bonnette, RTR  07/07/2014, 08:32

## 2014-07-17 ENCOUNTER — Telehealth (INDEPENDENT_AMBULATORY_CARE_PROVIDER_SITE_OTHER): Payer: Self-pay

## 2014-07-17 NOTE — Telephone Encounter (Signed)
Pt called and states [facility]. She was told to follow up for a UA if symptoms did not improve. She states she is not having symptoms at this time, but her urine is still dark. She would like to know if she can just come in for a UA without having to see you, just to make sure it has gone away as her CX grew multiple bacteria and she has a history of UTIs. Please advise. SQ

## 2014-07-19 NOTE — Telephone Encounter (Signed)
Yes she can come in and get a UA. Thanks.

## 2014-07-20 ENCOUNTER — Ambulatory Visit (INDEPENDENT_AMBULATORY_CARE_PROVIDER_SITE_OTHER)

## 2014-07-20 ENCOUNTER — Other Ambulatory Visit (HOSPITAL_BASED_OUTPATIENT_CLINIC_OR_DEPARTMENT_OTHER)
Admission: RE | Admit: 2014-07-20 | Discharge: 2014-07-20 | Disposition: A | Discharge status: Home / Self Care | Attending: Family Medicine | Admitting: Family Medicine

## 2014-07-20 DIAGNOSIS — N390 Urinary tract infection, site not specified: Principal | ICD-10-CM | POA: Insufficient documentation

## 2014-07-20 NOTE — Telephone Encounter (Signed)
LM on VM. SQ

## 2014-07-20 NOTE — Progress Notes (Signed)
07/20/14 1400   Urine   Time collected 1450   Glucose Negative   Bilirubin (!) 1+   Ketones Negative   Specific Gravity 1.025   Blood (urine) Trace Intact   pH 5.5   Protein Negative   Urobilinogen Normal    Nitrite Negative   Leukocytes Trace   Initials KM

## 2014-07-20 NOTE — Progress Notes (Signed)
Pt in for nurse visit to check for UTI, pt has h/o recurrent UTI's. Please see results in flowsheets and advise. Sent off for cx. KM

## 2014-07-22 LAB — URINE CULTURE

## 2014-07-26 ENCOUNTER — Other Ambulatory Visit (INDEPENDENT_AMBULATORY_CARE_PROVIDER_SITE_OTHER): Payer: Self-pay | Admitting: FAMILY MEDICINE

## 2014-07-26 DIAGNOSIS — R51 Headache: Principal | ICD-10-CM

## 2014-07-26 DIAGNOSIS — R519 Headache, unspecified: Secondary | ICD-10-CM

## 2014-07-26 MED ORDER — SUMATRIPTAN 50 MG TABLET
50.0000 mg | ORAL_TABLET | Freq: Once | ORAL | Status: AC | PRN
Start: 2014-07-26 — End: ?

## 2014-07-26 NOTE — Telephone Encounter (Signed)
Pt called requesting refill. Please advise.  Quantia Nock  07/26/2014, 10:30

## 2014-09-07 ENCOUNTER — Encounter (INDEPENDENT_AMBULATORY_CARE_PROVIDER_SITE_OTHER): Payer: Self-pay | Admitting: Physician Assistant

## 2014-09-07 ENCOUNTER — Ambulatory Visit (INDEPENDENT_AMBULATORY_CARE_PROVIDER_SITE_OTHER): Admitting: Physician Assistant

## 2014-09-07 VITALS — BP 128/90 | HR 80 | Temp 98.4°F | Resp 18 | Ht 67.0 in | Wt 247.8 lb

## 2014-09-07 DIAGNOSIS — J0100 Acute maxillary sinusitis, unspecified: Secondary | ICD-10-CM

## 2014-09-07 MED ORDER — DOXYCYCLINE HYCLATE 100 MG CAPSULE
100.00 mg | ORAL_CAPSULE | Freq: Two times a day (BID) | ORAL | Status: AC
Start: 2014-09-07 — End: 2014-09-17

## 2014-09-07 NOTE — Patient Instructions (Signed)
[facility]  [address] [place], [place] [address]  Phone: [phone])  Fax: [phone]               Open Mon - [phone] 8:00am - 8:00pm Ena Dawley- 8:00pm                                                              ~ Closed Thanksgiving and Christmas Day     Attending Caregiver: Mariella Saa, PA-C      Today's orders: No orders of the defined types were placed in this encounter.        Prescription(s) E[id number] [place]   ________________________________________________________________________  Short Term Disability and [facility]gent Care does NOT provide assistance with any disability applications.  If you feel your medical condition requires you to be on disability, you will need to follow up with  Your primary care physician or a specialist.  We apologize for any inconvenience.    For Medication Prescribed by [facility]:  As an Urgent Care facility, our clinic does NOT offer prescription refills over the telephone.    If you need more of the medication one of our medical providers prescribed, you will  Either need to be re-evaluated by u[place]or see your primary care physician.    ________________________________________________________________________      It is very important that we have a phone number that is the single best way to contact you in the event that we become aware of important clinical information or concerns after your discharge.  If the phone number you provided at registration is NOT this number you should inform staff and registration prior to leaving.      Your treatment and evaluation today was focused on identifying and treating potentially emergent conditions based on your presenting signs, symptoms, and history.  The resulting initial clinical impression and treatment plan is not intended to be definitive or a substitute for a full physical examination and evaluation by your primary care  provider.  If your symptoms persist, worsen, or you develop any new or concerning symptoms, you need to be evaluated.      If you received x-rays during your visit, be aware that the final and formal interpretation of those films by a radiologist may occur after your discharge.  If there is a significant discrepancy identified after your discharge, we will contact you at the telephone number provided at registration.      If you received a pelvic exam, you may have cultures pending for sexually transmitted diseases.  Positive cultures are reported to the [place] of Health[place] required by state law.  You should be contacted if you cultures are positive.  We will not contact you if they are negative.  You did NOT receive a PAP smear (the screening test for cervical).  This specific test for women is best performed by your gynecologist or primary care provider when indicated.      If you are over 148year old, we cannot discuss your personal health information with a parent, spouse, family member, or anyone else without your express consent.  This does not include those who have legitimate  access to your records and information to assist in your care under the provisions of HIPAA [facility] Portability and Accountability Act) law, or those to whom you have previously given express written consent to do so, such a legal guardian or Power of [place].      You may have received medication that may cause you to feel drowsy and/or light headed for several hours.  You may even experience some amnesia of your stay.  You should avoid operating a motor vehicle or performing any activity requiring complete alertness or coordination until you feel fully awake (approximately 24-48 hours).  Avoid alcoholic beverages.  You may also have a dry mouth for several hours.  This is a normal side effect and will disappear as the effects of the medication wear off.      Instructions discussed with patient upon discharge by  clinical staff with all questions answered.  Please call [facility] [phone]) if any further questions.  Go immediately to the emergency department if any concern or worsening symptoms.      Judeen Hammans, PA-C 09/07/2014, 12:28

## 2014-09-07 NOTE — Progress Notes (Addendum)
SUBJECTIVE:   Brittany Bailey is a 65 y.o. female who complains of sinus and nasal congestion, sore throat, nasal blockage, post nasal drip, myalgias, headache and bilateral sinus pain for 7 days, worsening since onset. She denies a history of fevers, wheezing, shortness of breath, chest pain, dizziness, nausea and vomiting and denies a history of asthma. Patient has taken daily clarinex, nasonex, and used netti pot for symptoms without improvement.  Patient states "my sinuses burn like I inhaled a bunch of pool water."  Pt also took Imitrex for headache without significant improvement and states, "I know it's not a migraine because the imitrex always works with my migraines."    ROS: no fever, +headaches, no wheezing or shortness of breath, no weakness or difficulty with ambulation, no skin rash or lesion      History   Substance Use Topics    Smoking status: Never Smoker     Smokeless tobacco: Never Used    Alcohol Use: No     Allergy History as of 09/07/14     PENICILLINS       Noted Status Severity Type Reaction    08/06/10 1314 [place], [place] 08/06/10 Active  Side Effect Hives/ Urticaria          SULFA (SULFONAMIDES)       Noted Status Severity Type Reaction    08/06/10 1314 [place], [place] 08/06/10 Active High Side Effect Hives/ Urticaria              Family History   Problem Relation Age of Onset    Arthritis-rheumatoid Neg Hx     Arthritis-osteo Neg Hx     Asthma Neg Hx     Breast Cancer Neg Hx     Thyroid Disease Mother     Hypertension Mother     Hypertension Father     Heart Attack Father     Thyroid Disease Sister     Heart Attack Brother     Hypertension Brother     No Known Problems Maternal Grandmother     No Known Problems Maternal Grandfather     No Known Problems Paternal Grandmother     No Known Problems Paternal Grandfather     No Known Problems Son     Thyroid Disease Sister     Hypertension Sister     No Known Problems Sister     Hypertension Brother     Hypertension  Brother          Past Medical History   Diagnosis Date    Thyroid disease     Overactive bladder     Reflux     Headache(784.0)          Patient Active Problem List   Diagnosis    Hypothyroidism       Outpatient Prescriptions Prior to Visit:  amitriptyline (ELAVIL) 50 mg Oral Tablet Take 1 Tab (50 mg total) by mouth Every night   Aspirin 81 mg Oral Tablet take 81 mg by mouth Once a day.   CALCIUM CARBONATE (CALCIUM 600 ORAL) take  by mouth.   Desloratadine (CLARINEX) 5 mg Oral Tablet Take 1 Tab (5 mg total) by mouth Once a day   ESOMEPRAZOLE MAGNESIUM (NEXIUM ORAL) take  by mouth.   levothyroxine (SYNTHROID) 75 mcg Oral Tablet Take 1 Tab (75 mcg total) by mouth Once a day   mometasone (NASONEX) 50 mcg/actuation Nasal Spray, Non-Aerosol 2 Sprays by Nasal route Once a day   Omega-3 Fatty Acids-Vitamin E (  FISH OIL) 1,000 mg Oral Capsule take 1,000 mg by mouth Twice daily.   ondansetron (ZOFRAN) 4 mg Oral Tablet Take 1 Tab (4 mg total) by mouth Every 12 hours as needed for nausea/vomiting (Patient not taking: Reported on 05/24/2014)   phenazopyridine (PYRIDIUM) 200 mg Oral Tablet Take 1 Tab (200 mg total) by mouth Three times a day as needed for Pain   SUMAtriptan (IMITREX) 50 mg Oral Tablet Take 1 Tab (50 mg total) by mouth Once, as needed for Migraine for up to 1 dose May repeat in 2 hours in needed   Topiramate (TOPAMAX) 50 mg Oral Tablet TAKE 1 TAB (50 MG TOTAL) BY MOUTH TWICE DAILY   VITAMIN B COMPLEX NO.12-NIACIN ORAL take  by mouth.     No facility-administered medications prior to visit.     OBJECTIVE:  BP 128/90 mmHg   Pulse 80   Temp(Src) 36.9 C (98.4 F) (Oral)   Resp 18   Ht 1.702 m ( )   Wt 112.401 kg (247 lb 12.8 oz)   BMI 38.80 kg/m2   SpO2 99%  She appears well, vital signs are as noted.   Ears: normal TM's and external ear canals AU.    Throat and pharynx no erythema, no hypertrophy, no exudates3   Neck supple. No adenopathy in the neck. Nose turbinates erythematous, edematous L>R Maxillary  sinuses +ttp bilaterally   The chest is clear, without wheezes or rales   Heart: regular rate and rhythm  Skin: Warm and dry, no rash or lesion      ASSESSMENT:     ICD-10-CM    1. Acute maxillary sinusitis, recurrence not specified J01.00 doxycycline hyclate (VIBRAMYCIN) 100 mg Oral Capsule       PLAN:  Doxycycline per Epic orders, risks and side effects reviewed.  Yogurt or probiotics and avoidance of sun exposure were discussed. Discussed good handwashing, may use tylenol for fever or pain Q4-6hrs.  Symptomatic therapy suggested: push fluids, rest, use nasal steroid, antihistamine of choice prn and ROV prn if symptoms persist or worsen. Pt verbalized understanding and agrees with the plan of care. Advised pt to call or return to clinic prn if these symptoms worsen or fail to improve as anticipated.    Judeen Hammans, PA-C 09/07/2014 12:46    My supervising physician today is Dr. Geanie Cooley.

## 2014-09-07 NOTE — Progress Notes (Signed)
BP 128/90 mmHg   Pulse 80   Temp(Src) 36.9 C (98.4 F) (Oral)   Resp 18   Ht 1.702 m (5\' 7" )   Wt 112.401 kg (247 lb 12.8 oz)   BMI 38.80 kg/m2   SpO2 99%  Jeanelle Malling, LPN  05/10/7987, 21:19

## 2014-09-08 ENCOUNTER — Encounter (INDEPENDENT_AMBULATORY_CARE_PROVIDER_SITE_OTHER): Payer: Self-pay

## 2014-09-08 NOTE — Progress Notes (Signed)
Pt. Called back for after care./ Pt. Has meds. But not feeling better yet.

## 2014-09-19 ENCOUNTER — Other Ambulatory Visit (INDEPENDENT_AMBULATORY_CARE_PROVIDER_SITE_OTHER): Payer: Self-pay

## 2014-09-20 MED ORDER — DESLORATADINE 5 MG TABLET
5.0000 mg | ORAL_TABLET | Freq: Every day | ORAL | Status: AC
Start: 2014-09-20 — End: ?

## 2014-09-29 ENCOUNTER — Encounter (INDEPENDENT_AMBULATORY_CARE_PROVIDER_SITE_OTHER): Payer: Self-pay | Admitting: FAMILY MEDICINE

## 2014-09-29 NOTE — Progress Notes (Signed)
Pt aware that PCP has left the practice, pt states they are moving to [place] next week anyway. Received refill request for topiramate 50 mg, pt does not need refill stating she has enough until she goes to [place]. KM

## 2014-11-24 ENCOUNTER — Other Ambulatory Visit (INDEPENDENT_AMBULATORY_CARE_PROVIDER_SITE_OTHER): Payer: Self-pay | Admitting: Family Medicine

## 2015-05-23 ENCOUNTER — Ambulatory Visit (INDEPENDENT_AMBULATORY_CARE_PROVIDER_SITE_OTHER): Admitting: Registered Nurse

## 2015-05-23 ENCOUNTER — Encounter (INDEPENDENT_AMBULATORY_CARE_PROVIDER_SITE_OTHER): Payer: Self-pay | Admitting: Registered Nurse

## 2015-05-23 VITALS — BP 120/85 | HR 80 | Temp 97.8°F | Resp 16 | Ht 68.0 in | Wt 224.6 lb

## 2015-05-23 DIAGNOSIS — J329 Chronic sinusitis, unspecified: Principal | ICD-10-CM

## 2015-05-23 MED ORDER — DOXYCYCLINE HYCLATE 100 MG CAPSULE
100.00 mg | ORAL_CAPSULE | Freq: Two times a day (BID) | ORAL | 0 refills | Status: AC
Start: 2015-05-23 — End: 2015-06-02

## 2015-05-23 NOTE — Progress Notes (Signed)
Visit Vitals   . BP 120/85   . Pulse 80   . Temp 36.6 C (97.8 F) (Oral)   . Resp 16   . Ht 1.727 m ( )   . Wt 101.9 kg (224 lb 9.6 oz)   . SpO2 100%   . BMI 34.15 kg/m2     Marcy Siren, RTR  05/23/2015, 15:38

## 2015-05-23 NOTE — Patient Instructions (Addendum)
[facility]  [address] [place], [place] [address]  Phone: [phone])  Fax: [phone]               Open Mon - Sat 8:00am - 8:00pm Ena Dawley- 8:00pm                                                              ~ Closed Thanksgiving and Christmas Day     Attending Caregiver: Alen Blew, APRN      Today's orders:   Orders Placed This Encounter    doxycycline hyclate (VIBRAMYCIN) 100 mg Oral Capsule        Prescription(s) E-Rx to:  [facility][id number] [place], [place]    ________________________________________________________________________  Short Term Disability and [facility] does NOT provide assistance with any disability applications.  If you feel your medical condition requires you to be on disability, you will need to follow up with  Your primary care physician or a specialist.  We apologize for any inconvenience.    For Medication Prescribed by [facility]:  As an Urgent Care facility, our clinic does NOT offer prescription refills over the telephone.    If you need more of the medication one of our medical providers prescribed, you will  Either need to be re-evaluated by [place] or see your primary care physician.    ________________________________________________________________________      It is very important that we have a phone number that is the single best way to contact you in the event that we become aware of important clinical information or concerns after your discharge.  If the phone number you provided at registration is NOT this number you should inform staff and registration prior to leaving.      Your treatment and evaluation today was focused on identifying and treating potentially emergent conditions based on your presenting signs, symptoms, and history.  The resulting initial clinical impression and treatment plan is not intended to be definitive or a substitute for a full physical examination and evaluation by  your primary care provider.  If your symptoms persist, worsen, or you develop any new or concerning symptoms, you need to be evaluated.      If you received x-rays during your visit, be aware that the final and formal interpretation of those films by a radiologist may occur after your discharge.  If there is a significant discrepancy identified after your discharge, we will contact you at the telephone number provided at registration.      If you received a pelvic exam, you may have cultures pending for sexually transmitted diseases.  Positive cultures are reported to the [place] Department of Health as required by state law.  You should be contacted if you cultures are positive.  We will not contact you if they are negative.  You did NOT receive a PAP smear (the screening test for cervical).  This specific test for women is best performed by your gynecologist or primary care provider when indicated.      If you are over 49 year old, we cannot discuss your personal health information with a parent, spouse, family member, or anyone else without your express consent.  This does not include  those who have legitimate access to your records and information to assist in your care under the provisions of HIPAA [facility] Portability and Accountability Act) law, or those to whom you have previously given express written consent to do so, such a legal guardian or Power of [place].      You may have received medication that may cause you to feel drowsy and/or light headed for several hours.  You may even experience some amnesia of your stay.  You should avoid operating a motor vehicle or performing any activity requiring complete alertness or coordination until you feel fully awake (approximately 24-48 hours).  Avoid alcoholic beverages.  You may also have a dry mouth for several hours.  This is a normal side effect and will disappear as the effects of the medication wear off.      Instructions discussed with patient upon  discharge by clinical staff with all questions answered.  Please call [facility] [phone]) if any further questions.  Go immediately to the emergency department if any concern or worsening symptoms.      Glenetta Hew, APRN 05/23/2015, 15:46          Sinusitis, Adult  Sinusitis is redness, soreness, and inflammation of the paranasal sinuses. Paranasal sinuses are air pockets within the bones of your face. They are located beneath your eyes, in the middle of your forehead, and above your eyes. In healthy paranasal sinuses, mucus is able to drain out, and air is able to circulate through them by way of your nose. However, when your paranasal sinuses are inflamed, mucus and air can become trapped. This can allow bacteria and other germs to grow and cause infection.  Sinusitis can develop quickly and last only a short time (acute) or continue over a long period (chronic). Sinusitis that lasts for more than 12 weeks is considered chronic.  CAUSES  Causes of sinusitis include:   Allergies.   Structural abnormalities, such as displacement of the cartilage that separates your nostrils (deviated septum), which can decrease the air flow through your nose and sinuses and affect sinus drainage.   Functional abnormalities, such as when the small hairs (cilia) that line your sinuses and help remove mucus do not work properly or are not present.  SIGNS AND SYMPTOMS  Symptoms of acute and chronic sinusitis are the same. The primary symptoms are pain and pressure around the affected sinuses. Other symptoms include:   Upper toothache.   Earache.   Headache.   Bad breath.   Decreased sense of smell and taste.   A cough, which worsens when you are lying flat.   Fatigue.   Fever.   Thick drainage from your nose, which often is green and may contain pus (purulent).   Swelling and warmth over the affected sinuses.  DIAGNOSIS  Your health care provider will perform a physical exam. During your exam, your health care  provider may perform any of the following to help determine if you have acute sinusitis or chronic sinusitis:   Look in your nose for signs of abnormal growths in your nostrils (nasal polyps).   Tap over the affected sinus to check for signs of infection.   View the inside of your sinuses using an imaging device that has a light attached (endoscope).  If your health care provider suspects that you have chronic sinusitis, one or more of the following tests may be recommended:   Allergy tests.   Nasal culture. A sample of mucus is taken  from your nose, sent to a lab, and screened for bacteria.   Nasal cytology. A sample of mucus is taken from your nose and examined by your health care provider to determine if your sinusitis is related to an allergy.  TREATMENT  Most cases of acute sinusitis are related to a viral infection and will resolve on their own within 10 days. Sometimes, medicines are prescribed to help relieve symptoms of both acute and chronic sinusitis. These may include pain medicines, decongestants, nasal steroid sprays, or saline sprays.  However, for sinusitis related to a bacterial infection, your health care provider will prescribe antibiotic medicines. These are medicines that will help kill the bacteria causing the infection.  Rarely, sinusitis is caused by a fungal infection. In these cases, your health care provider will prescribe antifungal medicine.  For some cases of chronic sinusitis, surgery is needed. Generally, these are cases in which sinusitis recurs more than 3 times per year, despite other treatments.  HOME CARE INSTRUCTIONS   Drink plenty of water. Water helps thin the mucus so your sinuses can drain more easily.   Use a humidifier.   Inhale steam 3-4 times a day (for example, sit in the bathroom with the shower running).   Apply a warm, moist washcloth to your face 3-4 times a day, or as directed by your health care provider.   Use saline nasal sprays to help moisten and  clean your sinuses.   Take medicines only as directed by your health care provider.   If you were prescribed either an antibiotic or antifungal medicine, finish it all even if you start to feel better.  SEEK IMMEDIATE MEDICAL CARE IF:   You have increasing pain or severe headaches.   You have nausea, vomiting, or drowsiness.   You have swelling around your face.   You have vision problems.   You have a stiff neck.   You have difficulty breathing.     This information is not intended to replace advice given to you by your health care provider. Make sure you discuss any questions you have with your health care provider.     Document Released: 03/17/2005 Document Revised: 04/07/2014 Document Reviewed: 04/01/2011  Elsevier Interactive Patient Education [employer].

## 2015-05-23 NOTE — Progress Notes (Addendum)
Subjective:     Patient ID:  Brittany Bailey is an 65 y.o. female   Chief Complaint:    Chief Complaint   Patient presents with    Sinus Pressure     X 3-4 Days    Headache    Ear Ache     RT       HPI Comments: Pt states Sinus pressure and headache, pain behind eyes x 4 days.  Pt states that she gets frequent sinus infections.  Pt denies cough, wheezing SOB.  Pt denies nausea, vomiting, diarrhea.  Pt  States that she is here from [place].  Pt states that she is allergic to [facility] and sulfa.      The history is provided by the patient.       Review of Systems   Constitutional: Negative for fatigue and fever.   HENT: Positive for congestion, ear pain, postnasal drip and sinus pressure. Negative for ear discharge, facial swelling, rhinorrhea, sneezing and sore throat.    Eyes: Negative for pain, discharge and itching.   Respiratory: Negative for cough, shortness of breath and wheezing.    Cardiovascular: Negative for chest pain.   Gastrointestinal: Negative for abdominal pain, nausea and vomiting.   Musculoskeletal: Negative for neck pain.   Skin: Negative for color change and pallor.   Neurological: Positive for headaches. Negative for light-headedness and numbness.   Psychiatric/Behavioral: Negative.      Objective:   Physical Exam   Constitutional: She is oriented to person, place, and time. She appears well-developed and well-nourished. No distress.   HENT:   Head: Normocephalic and atraumatic.   Right Ear: Hearing, tympanic membrane, external ear and ear canal normal.   Left Ear: Hearing, tympanic membrane, external ear and ear canal normal.   Nose: Nose normal.   Mouth/Throat: Uvula is midline and oropharynx is clear and moist.   Sinus pain and pressure   Eyes: Conjunctivae and EOM are normal. Pupils are equal, round, and reactive to light. Right eye exhibits no discharge. Left eye exhibits no discharge. No scleral icterus.   Neck: Normal range of motion. Neck supple.   Cardiovascular: Normal rate, regular  rhythm and normal heart sounds.  Exam reveals no gallop and no friction rub.    No murmur heard.  Pulmonary/Chest: Effort normal and breath sounds normal. No respiratory distress. She has no wheezes. She has no rales. She exhibits no tenderness.   Musculoskeletal: Normal range of motion.   Neurological: She is alert and oriented to person, place, and time.   Skin: Skin is warm. She is not diaphoretic.   Psychiatric: Judgment normal.   Nursing note and vitals reviewed.    Ortho Exam  Vital signs:        Vitals:    05/23/15 1537   BP: 120/85   Pulse: 80   Resp: 16   Temp: 36.6 C (97.8 F)   TempSrc: Oral   SpO2: 100%   Weight: 101.9 kg (224 lb 9.6 oz)   Height: 1.727 m (5\' 8" )       I reviewed and confirmed the patient's past medical history taken by the nurse or medical assistant with the addition of the following:    Past Medical History:           Past Medical History:   Diagnosis Date    Headache(784.0)     Overactive bladder     Reflux     Thyroid disease  Past Surgical History:          Past Surgical History:   Procedure Laterality Date    HX CHOLECYSTECTOMY      HX GASTRIC BYPASS      lap band    HX KNEE REPLACMENT      HX KNEE SURGERY      TKA bilateral         Allergies:        Allergies   Allergen Reactions    Sulfa (Sulfonamides) Hives/ Urticaria    Penicillins Hives/ Urticaria     Medications:         Current Outpatient Prescriptions   Medication Sig    amitriptyline (ELAVIL) 50 mg Oral Tablet Take 1 Tab (50 mg total) by mouth Every night    Aspirin 81 mg Oral Tablet take 81 mg by mouth Once a day.    CALCIUM CARBONATE (CALCIUM 600 ORAL) take  by mouth.    Desloratadine (CLARINEX) 5 mg Oral Tablet Take 1 Tab (5 mg total) by mouth Once a day    doxycycline hyclate (VIBRAMYCIN) 100 mg Oral Capsule Take 1 Cap (100 mg total) by mouth Twice daily for 10 days    ESOMEPRAZOLE MAGNESIUM (NEXIUM ORAL) take  by mouth.    levothyroxine (SYNTHROID) 75 mcg Oral Tablet Take 1 Tab (75 mcg total)  by mouth Once a day    mometasone (NASONEX) 50 mcg/actuation Nasal Spray, Non-Aerosol 2 Sprays by Nasal route Once a day    Omega-3 Fatty Acids-Vitamin E (FISH OIL) 1,000 mg Oral Capsule take 1,000 mg by mouth Twice daily.    SUMAtriptan (IMITREX) 50 mg Oral Tablet Take 1 Tab (50 mg total) by mouth Once, as needed for Migraine for up to 1 dose May repeat in 2 hours in needed    Topiramate (TOPAMAX) 50 mg Oral Tablet TAKE 1 TAB (50 MG TOTAL) BY MOUTH TWICE DAILY    VITAMIN B COMPLEX NO.12-NIACIN ORAL take  by mouth.     Social History:         Social History   Substance Use Topics    Smoking status: Never Smoker    Smokeless tobacco: Never Used    Alcohol use No     Family History:       Family History   Problem Relation Age of Onset    Thyroid Disease Mother     Hypertension Mother     Hypertension Father     Heart Attack Father     Thyroid Disease Sister     Heart Attack Brother     Hypertension Brother     No Known Problems Maternal Grandmother     No Known Problems Maternal Grandfather     No Known Problems Paternal Grandmother     No Known Problems Paternal Grandfather     No Known Problems Son     Thyroid Disease Sister     Hypertension Sister     No Known Problems Sister     Hypertension Brother     Hypertension Brother     Arthritis-rheumatoid Neg Hx     Arthritis-osteo Neg Hx     Asthma Neg Hx     Breast Cancer Neg Hx            Data Reviewed     Not applicable    Course:      Condition at discharge: Good       Assessment and Plan:    ICD-10-CM  1. Sinusitis, unspecified chronicity, unspecified location J32.9      Medication Orders   Medications    doxycycline hyclate (VIBRAMYCIN) 100 mg Oral Capsule     Sig: Take 1 Cap (100 mg total) by mouth Twice daily for 10 days     Dispense:  20 Cap     Refill:  0         Orders Placed This Encounter    doxycycline hyclate (VIBRAMYCIN) 100 mg Oral Capsule         Assessment & Plan:     Sinusitis:  Treat with doxy due to PCN allergies.   recommened flonase and claritin or zyrtec.   Encouraged increase fluids and rest, use humidifier,  may take tylenol for fever. If symptoms to not improve follow up with PCP or return     Encouraged follow up with ENT due to frequent Sinus infections.     Plan was discussed and patient/parent verbalized understanding.  If symptoms are not improving within the next couple of days,  advised patient/parent  to followup with primary care or return to the Urgent Care for further evaluation.  Go to Emergency Department immediately for further work up if worsening symptoms or other medical concerns.      Glenetta Hew, APRN 05/23/2015, 15:49      Discussed with MLP , agree with the findings and plan    Cristie Hem, MD  05/23/2015, 17:41

## 2015-08-14 ENCOUNTER — Ambulatory Visit (INDEPENDENT_AMBULATORY_CARE_PROVIDER_SITE_OTHER): Admitting: Family Medicine

## 2015-08-14 ENCOUNTER — Encounter (INDEPENDENT_AMBULATORY_CARE_PROVIDER_SITE_OTHER): Payer: Self-pay | Admitting: Family Medicine

## 2015-08-14 VITALS — BP 118/84 | HR 88 | Temp 98.6°F | Resp 16 | Ht 67.5 in | Wt 219.0 lb

## 2015-08-14 DIAGNOSIS — S01331A Puncture wound without foreign body of right ear, initial encounter: Secondary | ICD-10-CM

## 2015-08-14 DIAGNOSIS — Z6833 Body mass index (BMI) 33.0-33.9, adult: Secondary | ICD-10-CM

## 2015-08-14 DIAGNOSIS — H60391 Other infective otitis externa, right ear: Secondary | ICD-10-CM

## 2015-08-14 MED ORDER — DOXYCYCLINE HYCLATE 100 MG CAPSULE
100.00 mg | ORAL_CAPSULE | Freq: Two times a day (BID) | ORAL | 0 refills | Status: AC
Start: 2015-08-14 — End: 2015-08-24

## 2015-08-14 MED ORDER — MUPIROCIN 2 % TOPICAL OINTMENT
TOPICAL_OINTMENT | CUTANEOUS | 0 refills | Status: AC
Start: 2015-08-14 — End: ?

## 2015-08-14 NOTE — Progress Notes (Signed)
[facility]  [facility]  [address], [address]  [place] [id number]  Dept: [phone]  Dept Fax: [phone]  Loc: [phone]  Loc Fax: [phone]     Patient Name: Brittany Bailey  Date of Service: 08/14/15  Date of Birth: Jun 13, 1950    Chief Complaint: Infected Piercing      HPI: Brittany Bailey is a 65 y.o. female who presents today with infected ear piercing in the cartilage of her Rt ear. She had it done for migraine 1 week back. She said that her headaches have gone away. She noticed swelling and pain on the pinna few days later. She visited the person who did the ear piercing and was told that it looks infected, so she came here.      She is allergic to penicillin and sulfa.  History:  Vital signs and history as obtained by clinical staff.  Past Medical History:   Diagnosis Date    Headache(784.0)     Overactive bladder     Reflux     Thyroid disease          Past Surgical History:   Procedure Laterality Date    HX CHOLECYSTECTOMY      HX GASTRIC BYPASS      lap band    HX KNEE REPLACMENT      HX KNEE SURGERY      TKA bilateral         Family History   Problem Relation Age of Onset    Thyroid Disease Mother     Hypertension Mother     Hypertension Father     Heart Attack Father     Thyroid Disease Sister     Heart Attack Brother     Hypertension Brother     No Known Problems Maternal Grandmother     No Known Problems Maternal Grandfather     No Known Problems Paternal Grandmother     No Known Problems Paternal Grandfather     No Known Problems Son     Thyroid Disease Sister     Hypertension Sister     No Known Problems Sister     Hypertension Brother     Hypertension Brother     Arthritis-rheumatoid Neg Hx     Arthritis-osteo Neg Hx     Asthma Neg Hx     Breast Cancer Neg Hx          Social History     Social History    Marital status: Married     Spouse name: N/A    Number of children: N/A    Years of education: N/A     Social  History Main Topics    Smoking status: Never Smoker    Smokeless tobacco: Never Used    Alcohol use No    Drug use: No    Sexual activity: Yes     Partners: Male     Other Topics Concern    Not on file     Social History Narrative     Expanded Substance History     Additional history       Allergies:  Allergies   Allergen Reactions    Sulfa (Sulfonamides) Hives/ Urticaria    Penicillins Hives/ Urticaria     Problem List:  Patient Active Problem List    Diagnosis    Hypothyroidism     Medication:  Outpatient Encounter Prescriptions as of 08/14/2015   Medication Sig Dispense Refill  amitriptyline (ELAVIL) 50 mg Oral Tablet Take 1 Tab (50 mg total) by mouth Every night 30 Tab 5    Aspirin 81 mg Oral Tablet take 81 mg by mouth Once a day.      CALCIUM CARBONATE (CALCIUM 600 ORAL) take  by mouth.      Desloratadine (CLARINEX) 5 mg Oral Tablet Take 1 Tab (5 mg total) by mouth Once a day 30 Tab 2    doxycycline hyclate (VIBRAMYCIN) 100 mg Oral Capsule Take 1 Cap (100 mg total) by mouth Twice daily for 10 days 20 Cap 0    ESOMEPRAZOLE MAGNESIUM (NEXIUM ORAL) take  by mouth.      levothyroxine (SYNTHROID) 75 mcg Oral Tablet Take 1 Tab (75 mcg total) by mouth Once a day 30 Tab 5    mometasone (NASONEX) 50 mcg/actuation Nasal Spray, Non-Aerosol 2 Sprays by Nasal route Once a day 1 Bottle 5    mupirocin (BACTROBAN) 2 % Ointment Apply a thin layer to effected area 2 times a day for 10 days 1 Tube 0    Omega-3 Fatty Acids-Vitamin E (FISH OIL) 1,000 mg Oral Capsule take 1,000 mg by mouth Twice daily.      SUMAtriptan (IMITREX) 50 mg Oral Tablet Take 1 Tab (50 mg total) by mouth Once, as needed for Migraine for up to 1 dose May repeat in 2 hours in needed 10 Tab 5    Topiramate (TOPAMAX) 50 mg Oral Tablet TAKE 1 TAB (50 MG TOTAL) BY MOUTH TWICE DAILY 3060 Tab 5    VITAMIN B COMPLEX NO.12-NIACIN ORAL take  by mouth.       No facility-administered encounter medications on file as of 08/14/2015.        Review of  Systems:  Pertinent items are noted in HPI. All pertinent positives and negatives noted in the HPI  Constitutional: No recent illness, no fever, no chills, no night sweats, no weight loss.  Neuro: No dizziness, No lightheadedness, No syncope, no hx of seizures  Skin: Infected ear piercing.    Exam:  General Vitals: BP 118/84   Pulse 88   Temp 37 C (98.6 F) (Oral)    Resp 16   Ht 1.715 m (5' 7.5")   Wt 99.3 kg (219 lb)   SpO2 99%   BMI 33.79 kg/m2  General: acutely ill and mild distress  Eyes: Conjunctivae/corneas clear, PERRLA, EOM's intact.   Skin: Left pinna swollen and tender.  Neurologic: alert and oriented x3, CN 2-12 grossly intact, B/L upper and lower extremity muscle strength-5/5     Assessment and Plan:    ICD-10-CM    1. Infection, pinna, acute, right H60.391    2. Complication of right ear piercing S01.331A      Medication Orders   Medications    mupirocin (BACTROBAN) 2 % Ointment     Sig: Apply a thin layer to effected area 2 times a day for 10 days     Dispense:  1 Tube     Refill:  0    doxycycline hyclate (VIBRAMYCIN) 100 mg Oral Capsule     Sig: Take 1 Cap (100 mg total) by mouth Twice daily for 10 days     Dispense:  20 Cap     Refill:  0    Infection at the site of the ear piercing that she got for migraine treatment.  She says that her headaches have improved.  The site of the piercing looks infected the swelling and tenderness and  redness of the right ear pinna I will prescribe doxycycline and Bactroban for the same.  Advised warm compresses on the left pinna.    Antibiotics as above, with warnings including allergic reaction, and C. Difficile, both of which can be severe. Patient advised these type of reactions are  unpredictable. Yogurt/probiotics prophylaxis discussed. Pt verbalised understanding to th above plan & verbalised understanding.All questions answered completely.            Plan was discussed and patient/parent verbalized understanding.  If symptoms are not improving within the  next couple of days,  advised patient/parent  to followup with primary care or return to the Urgent Care for further evaluation.  Go to Emergency Department immediately for further work up if worsening symptoms or other medical concerns.      Cristie Hem, MD  08/14/2015, 15:33

## 2015-08-14 NOTE — Patient Instructions (Addendum)
[facility]  [address]  [place], [place] [address]  Phone: [phone]  Fax: [phone]               Open Mon - Sat 8:00am - 8:00pm Lahoma Crocker- 8:00pm                                                              ~ Closed Thanksgiving and Christmas Day     Attending Caregiver: Cristie Hem, MD      Today's orders:   Orders Placed This Encounter    mupirocin (BACTROBAN) 2 % Ointment    doxycycline hyclate (VIBRAMYCIN) 100 mg Oral Capsule        Prescription(s) E-Rx to:  [id number] - [place], [place] - [address]    ________________________________________________________________________  Short Term Disability and Family Medical Leave Act  [facility] does NOT provide assistance with any disability applications.  If you feel your medical condition requires you to be on disability, you will need to follow up with  Your primary care physician or a specialist.  We apologize for any inconvenience.    For Medication Prescribed by [facility]:  As an Urgent Care facility, our clinic does NOT offer prescription refills over the telephone.    If you need more of the medication one of our medical providers prescribed, you will  Either need to be re-evaluated by [place] or see your primary care physician.    ________________________________________________________________________      It is very important that we have a phone number that is the single best way to contact you in the event that we become aware of important clinical information or concerns after your discharge.  If the phone number you provided at registration is NOT this number you should inform staff and registration prior to leaving.      Your treatment and evaluation today was focused on identifying and treating potentially emergent conditions based on your presenting signs, symptoms, and history.  The resulting initial clinical impression and treatment plan is not intended to be definitive or a substitute  for a full physical examination and evaluation by your primary care provider.  If your symptoms persist, worsen, or you develop any new or concerning symptoms, you need to be evaluated.      If you received x-rays during your visit, be aware that the final and formal interpretation of those films by a radiologist may occur after your discharge.  If there is a significant discrepancy identified after your discharge, we will contact you at the telephone number provided at registration.      If you received a pelvic exam, you may have cultures pending for sexually transmitted diseases.  Positive cultures are reported to the [facility] Department of Health as required by state law.  You should be contacted if you cultures are positive.  We will not contact you if they are negative.  You did NOT receive a PAP smear (the screening test for cervical).  This specific test for women is best performed by your gynecologist or primary care provider when indicated.      If you are over 55 year old, we cannot discuss your personal health information with a parent, spouse, family member, or anyone  else without your express consent.  This does not include those who have legitimate access to your records and information to assist in your care under the provisions of HIPAA [facility](Health Insurance Portability and Accountability Act) law, or those to whom you have previously given express written consent to do so, such a legal guardian or Power of [place].      You may have received medication that may cause you to feel drowsy and/or light headed for several hours.  You may even experience some amnesia of your stay.  You should avoid operating a motor vehicle or performing any activity requiring complete alertness or coordination until you feel fully awake (approximately 24-48 hours).  Avoid alcoholic beverages.  You may also have a dry mouth for several hours.  This is a normal side effect and will disappear as the effects of the medication wear  off.      Instructions discussed with patient upon discharge by clinical staff with all questions answered.  Please call [facility] [phone] if any further questions.  Go immediately to the emergency department if any concern or worsening symptoms.      Cristie HemPriyankar Exzavier Ruderman, MD 08/14/2015, 15:44        Otitis Externa  Otitis externa is a germ infection in the outer ear. The outer ear is the area from the eardrum to the outside of the ear. Otitis externa is sometimes called "swimmer's ear."  HOME CARE   Put drops in the ear as told by your doctor.   Only take medicine as told by your doctor.   If you have diabetes, your doctor may give you more directions. Follow your doctor's directions.   Keep all doctor visits as told.  To avoid another infection:   Keep your ear dry. Use the corner of a towel to dry your ear after swimming or bathing.   Avoid scratching or putting things inside your ear.   Avoid swimming in lakes, dirty water, or pools that use a chemical called chlorine poorly.   You may use ear drops after swimming. Combine equal amounts of white vinegar and alcohol in a bottle. Put 3 or 4 drops in each ear.  GET HELP IF:    You have a fever.   Your ear is still red, puffy (swollen), or painful after 3 days.   You still have yellowish-white fluid (pus) coming from the ear after 3 days.   Your redness, puffiness, or pain gets worse.   You have a really bad headache.   You have redness, puffiness, pain, or tenderness behind your ear.  MAKE SURE YOU:    Understand these instructions.   Will watch your condition.   Will get help right away if you are not doing well or get worse.     This information is not intended to replace advice given to you by your health care provider. Make sure you discuss any questions you have with your health care provider.     Document Released: 09/03/2007 Document Revised: 04/07/2014 Document Reviewed: 04/03/2011  Elsevier Interactive Patient Education [employer]2016  [employer]vier Inc.

## 2015-08-14 NOTE — Progress Notes (Signed)
BP 118/84   Pulse 88   Temp 37 C (98.6 F) (Oral)    Resp 16   Ht 1.715 m (5' 7.5")   Wt 99.3 kg (219 lb)   SpO2 99%   BMI 33.79 kg/m2  United Technologies CorporationHeather Charls Custer, MA  08/14/2015, 15:29

## 2017-04-07 ENCOUNTER — Encounter (INDEPENDENT_AMBULATORY_CARE_PROVIDER_SITE_OTHER): Payer: Self-pay

## 2017-06-01 ENCOUNTER — Encounter (INDEPENDENT_AMBULATORY_CARE_PROVIDER_SITE_OTHER): Payer: Self-pay

## 2020-09-28 ENCOUNTER — Encounter (HOSPITAL_BASED_OUTPATIENT_CLINIC_OR_DEPARTMENT_OTHER): Admission: RE | Payer: Self-pay | Source: Ambulatory Visit

## 2020-09-28 ENCOUNTER — Inpatient Hospital Stay (HOSPITAL_BASED_OUTPATIENT_CLINIC_OR_DEPARTMENT_OTHER): Admission: RE | Admit: 2020-09-28 | Source: Ambulatory Visit | Admitting: PHYSICAL MEDICINE AND REHABILITATION

## 2020-09-28 SURGERY — SI INJECTION
Laterality: Left

## 2020-12-02 ENCOUNTER — Ambulatory Visit: Payer: Medicare Other | Admitting: Family

## 2020-12-02 VITALS — BP 113/84 | HR 93 | Temp 98.6°F | Resp 14 | Ht 68.0 in | Wt 226.0 lb

## 2020-12-02 DIAGNOSIS — U071 COVID-19: Secondary | ICD-10-CM

## 2020-12-02 DIAGNOSIS — R059 Cough, unspecified: Secondary | ICD-10-CM

## 2020-12-02 LAB — VH AMB POCT SOFIA (TM)SARS CORONAVIRUS ANTIGEN FIA: Sofia SARS-CoV-2 Ag POCT: POSITIVE — AB

## 2020-12-02 NOTE — Progress Notes (Signed)
Subjective:    Patient ID: Hannah Barnes is a 70 y.o. female.    Chief Complaint   Patient presents with    Cough     With congestion, fatigue, headache -- started 2 days ago. Husband and lots of other family have covid currently     Cough  This is a new problem. The current episode started yesterday. The problem has been unchanged. The problem occurs every few hours. The cough is Non-productive. Associated symptoms include nasal congestion, postnasal drip and rhinorrhea. Pertinent negatives include no chest pain, chills, ear congestion, ear pain, fever, headaches, heartburn, hemoptysis, myalgias, rash, sore throat, shortness of breath, sweats, weight loss or wheezing. Nothing aggravates the symptoms. Risk factors: exposed to covid 19. She has tried nothing for the symptoms. The treatment provided no relief. There is no history of environmental allergies.     Past Medical History:   Diagnosis Date    Arthritis     Bil knees s/p knee replacements    Headache     Migraines since a child controlled on Topamax    Hiatal hernia     Hypothyroidism     Stable on synthroid dose > 6 months    Morbid obesity with BMI of 45.0-49.9, adult     BMI 49.8    Post-operative nausea and vomiting     Mild-Moderate     Current Outpatient Medications on File Prior to Visit   Medication Sig Dispense Refill    desloratadine (CLARINEX) 5 MG tablet Take 5 mg by mouth every evening.      doxepin (SINEquan) 10 MG capsule Take 10 mg by mouth nightly      levothyroxine (SYNTHROID, LEVOTHROID) 75 MCG tablet Take 75 mcg by mouth Once a day at 6:00am.      Multiple Vitamin (MULTIVITAMIN) tablet Take 1 tablet by mouth daily.      [DISCONTINUED] amitriptyline (ELAVIL) 50 MG tablet Take 50 mg by mouth nightly. (Patient not taking: Reported on 12/02/2020)      [DISCONTINUED] pantoprazole (PROTONIX) 40 MG tablet Take 1 tablet (40 mg total) by mouth daily. (Patient not taking: Reported on 12/02/2020) 30 tablet 2    [DISCONTINUED] SUMAtriptan (IMITREX)  50 MG tablet Take 50 mg by mouth every 2 (two) hours as needed for Migraine. (Patient not taking: Reported on 12/02/2020)      [DISCONTINUED] topiramate (TOPAMAX) 50 MG tablet Take 50 mg by mouth 2 (two) times daily. (Patient not taking: Reported on 12/02/2020)       No current facility-administered medications on file prior to visit.     Penicillins and Sulfa antibiotics    The following portions of the patient's history were reviewed and updated as appropriate: allergies, current medications, past family history, past medical history, past social history, past surgical history, and problem list.      Review of Systems   Constitutional:  Negative for chills, fever and weight loss.   HENT:  Positive for congestion, postnasal drip and rhinorrhea. Negative for ear pain and sore throat.    Respiratory:  Positive for cough. Negative for hemoptysis, shortness of breath and wheezing.    Cardiovascular:  Negative for chest pain.   Gastrointestinal:  Negative for heartburn and nausea.   Endocrine: Negative for cold intolerance and heat intolerance.   Musculoskeletal:  Negative for arthralgias and myalgias.   Skin:  Negative for rash.   Allergic/Immunologic: Negative for environmental allergies and food allergies.   Neurological:  Negative for headaches.   Hematological:  Does not bruise/bleed easily.   Psychiatric/Behavioral:  Negative for sleep disturbance.        Objective:    BP 113/84   Pulse 93   Temp 98.6 F (37 C) (Tympanic)   Resp 14   Ht 1.727 m (5\' 8" )   Wt 102.5 kg (226 lb)   BMI 34.36 kg/m     Physical Exam  Constitutional:       Appearance: Normal appearance. She is well-developed. She is not ill-appearing.   HENT:      Head: Normocephalic and atraumatic.      Right Ear: Tympanic membrane and external ear normal.      Left Ear: Tympanic membrane and external ear normal.      Nose: No mucosal edema, congestion or rhinorrhea.      Right Sinus: No maxillary sinus tenderness or frontal sinus tenderness.       Left Sinus: No maxillary sinus tenderness or frontal sinus tenderness.      Mouth/Throat:      Lips: Pink.      Mouth: Mucous membranes are moist.      Pharynx: No pharyngeal swelling, oropharyngeal exudate or posterior oropharyngeal erythema.      Tonsils: No tonsillar exudate.   Eyes:      Conjunctiva/sclera: Conjunctivae normal.   Cardiovascular:      Rate and Rhythm: Normal rate and regular rhythm.      Heart sounds: Normal heart sounds.   Pulmonary:      Effort: Pulmonary effort is normal. No respiratory distress.      Breath sounds: Normal breath sounds. No wheezing or rales.   Abdominal:      General: Abdomen is flat.      Palpations: Abdomen is soft.   Musculoskeletal:      Cervical back: Normal range of motion and neck supple.   Skin:     General: Skin is warm and dry.   Neurological:      Mental Status: She is alert and oriented to person, place, and time.   Psychiatric:         Mood and Affect: Mood normal.         Behavior: Behavior normal.         Results       Procedure Component Value Units Date/Time    [facility] SARS Coronavirus Antigen [facility] POCT [[id number]]  (Abnormal) Collected: 12/02/20 1000    Specimen: Nasal Swab COVID-19 Updated: 12/02/20 1018     [facility] SARS-CoV-2 Ag POCT Positive            Assessment and Plan:       Hannah Barnes was seen today for cough.    Diagnoses and all orders for this visit:    COVID-19    Cough  -     [facility] SARS Coronavirus Antigen [facility] POCT        - Reviewed test results with patient. Discussed positive COVID 19 results.   - Discussed that this is a virus and should resolve within 5-10d, symptoms may worsen before they improve.   - Discussed OTC supportive therapy recommendations.   - Educated patient on quarantine recommendations provided by the CDC.   - Documentation of results provided.   - Strict return precautions provided to patient. Red flag symptoms that should warrant immediate evaluation were discussed.   - Patient/guardian understands and agrees with plan as  listed above.   - Patient to f/u with PCP following COVID    Hannah Barnes Boss  Bing Quarry, NP  [facility]  12/02/2020  4:26 PM

## 2021-07-04 ENCOUNTER — Other Ambulatory Visit
Admission: RE | Admit: 2021-07-04 | Discharge: 2021-07-04 | Disposition: A | Discharge status: Home / Self Care | Payer: Self-pay | Source: Ambulatory Visit | Attending: Family | Admitting: Family

## 2021-07-04 ENCOUNTER — Ambulatory Visit: Payer: Medicare Other | Admitting: Family

## 2021-07-04 VITALS — BP 143/81 | HR 67 | Temp 97.8°F | Resp 16 | Ht 68.0 in | Wt 214.0 lb

## 2021-07-04 DIAGNOSIS — R300 Dysuria: Secondary | ICD-10-CM

## 2021-07-04 DIAGNOSIS — N3001 Acute cystitis with hematuria: Secondary | ICD-10-CM

## 2021-07-04 DIAGNOSIS — J019 Acute sinusitis, unspecified: Secondary | ICD-10-CM

## 2021-07-04 LAB — VH POCT UA-AUTOMATED(UCC)
Bilirubin, UA POCT: NEGATIVE
Glucose, UA POCT: NEGATIVE
Ketones, UA POCT: NEGATIVE mg/dL
Nitrite, UA POCT: NEGATIVE
PH, UA POCT: 6.5 (ref 4.6–8)
Protein, UA POCT: NEGATIVE mg/dL
Specific Gravity, UA POCT: 1.015 mg/dL (ref 1.001–1.035)
Urine Leukocytes POCT: NEGATIVE
Urobilinogen, UA POCT: 0.2 mg/dL

## 2021-07-04 MED ORDER — CEPHALEXIN 500 MG PO CAPS
500.0000 mg | ORAL_CAPSULE | Freq: Three times a day (TID) | ORAL | 0 refills | Status: DC
Start: ? — End: 2021-07-04

## 2021-07-04 NOTE — Progress Notes (Signed)
Subjective:    Patient ID: Hannah Barnes is a 71 y.o. female.    Chief Complaint   Patient presents with    Dysuria     Pressure, urgency     Sinus Problem     1 week     Patient is a 71 year old female presenting to clinic today for possible UTI x 2-3 days.  Symptoms include abdominal pressure, urgency, frequency.  Patient is also having sinus pain and pressure x1 week.    Dysuria   This is a new problem. The current episode started in the past 7 days (2-3 days ago). Associated symptoms include frequency and urgency. Pertinent negatives include no chills, discharge, flank pain, nausea or vomiting.     The following portions of the patient's history were reviewed and updated as appropriate: allergies, current medications, past family history, past medical history, past social history, past surgical history, and problem list.    Past Medical History:   Diagnosis Date    Arthritis     Bil knees s/p knee replacements    Headache     Migraines since a child controlled on Topamax    Hiatal hernia     Hypothyroidism     Stable on synthroid dose > 6 months    Morbid obesity with BMI of 45.0-49.9, adult     BMI 49.8    Post-operative nausea and vomiting     Mild-Moderate     Current Outpatient Medications on File Prior to Visit   Medication Sig Dispense Refill    doxepin (SINEquan) 10 MG capsule Take 10 mg by mouth nightly      levothyroxine (SYNTHROID, LEVOTHROID) 75 MCG tablet Take 75 mcg by mouth Once a day at 6:00am.      montelukast (SINGULAIR) 10 MG tablet Take 10 mg by mouth nightly      Multiple Vitamin (MULTIVITAMIN) tablet Take 1 tablet by mouth daily.      desloratadine (CLARINEX) 5 MG tablet Take 5 mg by mouth every evening. (Patient not taking: Reported on 07/04/2021)       No current facility-administered medications on file prior to visit.     Allergies   Allergen Reactions    Penicillins Hives    Sulfa Antibiotics Hives     Review of Systems   Constitutional:  Negative for chills, fatigue and fever.    HENT:  Positive for congestion, sinus pressure and sinus pain.    Respiratory:  Negative for cough and shortness of breath.    Cardiovascular:  Negative for chest pain.   Gastrointestinal:  Positive for abdominal pain (pressure). Negative for diarrhea, nausea and vomiting.   Genitourinary:  Positive for frequency and urgency. Negative for dysuria, flank pain and vaginal discharge.   Musculoskeletal:  Negative for arthralgias and myalgias.   Skin:  Negative for rash.   Neurological:  Negative for headaches.     Objective:    BP 143/81   Pulse 67   Temp 97.8 F (36.6 C) (Tympanic)   Resp 16   Ht 1.727 m (5\' 8" )   Wt 97.1 kg (214 lb)   BMI 32.54 kg/m     Physical Exam  Vitals reviewed.   Constitutional:       General: She is not in acute distress.     Appearance: Normal appearance.   HENT:      Head: Normocephalic and atraumatic.      Nose: Congestion present.   Eyes:  Extraocular Movements: Extraocular movements intact.   Cardiovascular:      Rate and Rhythm: Normal rate and regular rhythm.      Heart sounds: Normal heart sounds.   Pulmonary:      Effort: Pulmonary effort is normal. No respiratory distress.      Breath sounds: Normal breath sounds. No stridor. No wheezing, rhonchi or rales.   Abdominal:      General: Bowel sounds are normal. There is no distension.      Palpations: Abdomen is soft.      Tenderness: There is no abdominal tenderness. There is no right CVA tenderness or left CVA tenderness.   Musculoskeletal:         General: Normal range of motion.      Cervical back: Normal range of motion.   Skin:     General: Skin is warm and dry.      Findings: No rash.   Neurological:      Mental Status: She is alert and oriented to person, place, and time.   Psychiatric:         Mood and Affect: Mood normal.         Behavior: Behavior normal.       Results       Procedure Component Value Units Date/Time    VH POCT UA AUTO [[id number]]  (Abnormal) Collected: 07/04/21 1140     Updated: 07/04/21 1146      Urine Color POCT Yellow     Urine Clarity POCT Clear     Glucose, UA POCT Negative     Bilirubin, UA POCT Negative     Ketones, UA POCT Negative mg/dL      Specific Gravity, UA POCT 1.015 mg/dL      Blood, UA POCT  Small     PH, UA POCT 6.5     Protein, UA POCT Negative mg/dL      Urobilinogen, UA POCT 0.2 mg/dL      Nitrite, UA POCT Negative     Urine Leukocytes POCT Negative          Assessment and Plan:       Hannah Barnes was seen today for dysuria and sinus problem.    Diagnoses and all orders for this visit:    Acute cystitis with hematuria  Acute non-recurrent sinusitis, unspecified location  -     cephALEXin (KEFLEX) 500 MG capsule; Take 1 capsule (500 mg) by mouth 3 (three) times daily for 10 days  -     Urine Culture    Dysuria  -     VH POCT UA AUTO    - Discussed UA results with patient.  - Will order UC. Will notify patient when results are available.   - ABX will treat for sinusitis also.  - Supportive care discussed with patient.   - Instructed patient to take abx with food to minimize GI symptoms.  - Take full course of abx.  - Drink plenty of water.  - F/u with PCP or return to clinic if symptoms worsen or persist.  - If you develop severe lower back pain or abdominal pain, go to the ER immediately to be evaluated.  - Patient verbalizes understanding and agrees with plan.    Lalla Brothers, NP  [facility]  07/04/2021  12:51 PM

## 2021-07-05 LAB — VH CULTURE, URINE

## 2021-08-04 ENCOUNTER — Ambulatory Visit: Payer: Medicare Other | Admitting: Family

## 2021-08-04 VITALS — BP 136/82 | HR 95 | Temp 97.8°F | Resp 16 | Ht 68.0 in | Wt 218.0 lb

## 2021-08-04 DIAGNOSIS — J0140 Acute pansinusitis, unspecified: Secondary | ICD-10-CM

## 2021-08-04 NOTE — Progress Notes (Signed)
Subjective:    Patient ID: Hannah Barnes is a 71 y.o. female.    PPE: The patient wears a surgical mask. The clinical staff and I wear surgical mask and gloves when appropriate.    Chief Complaint   Patient presents with    Sinus Problem     Pt reports HA, ears popping, congestion x 1 week       Hannah Barnes is a 71 y.o. F here today with c/o sinus congestion, HA, and bilateral ear popping for the past week.   Patient reports that she has been taking OTC sinus medication and a daily antihistamine but has not had any relief.   She is worried because she is flying home to [place] on Friday to have a left hip replacement surgery.   She is worried about a ruptured ear drum on the plane d/t congestion.   Denies fever, cough, or GI sx.         The following portions of the patient's history were reviewed and updated as appropriate: allergies, current medications, past family history, past medical history, past social history, past surgical history, and problem list.    Past Medical History:   Diagnosis Date    Arthritis     Bil knees s/p knee replacements    Headache     Migraines since a child controlled on Topamax    Hiatal hernia     Hypothyroidism     Stable on synthroid dose > 6 months    Morbid obesity with BMI of 45.0-49.9, adult     BMI 49.8    Post-operative nausea and vomiting     Mild-Moderate     Current Outpatient Medications on File Prior to Visit   Medication Sig Dispense Refill    doxepin (SINEquan) 10 MG capsule Take 10 mg by mouth nightly      levothyroxine (SYNTHROID, LEVOTHROID) 75 MCG tablet Take 75 mcg by mouth Once a day at 6:00am.      montelukast (SINGULAIR) 10 MG tablet Take 10 mg by mouth nightly      Multiple Vitamin (MULTIVITAMIN) tablet Take 1 tablet by mouth daily.       No current facility-administered medications on file prior to visit.     Allergies   Allergen Reactions    Penicillins Hives    Sulfa Antibiotics Hives         Review of Systems   Constitutional:  Negative for appetite  change, fatigue and fever.   HENT:  Positive for congestion, ear pain (bilateral popping in ears), postnasal drip, rhinorrhea, sinus pressure and sinus pain. Negative for sore throat and trouble swallowing.    Respiratory:  Negative for cough, chest tightness, shortness of breath and wheezing.    Cardiovascular: Negative.    Gastrointestinal: Negative.    Skin: Negative.    Neurological:  Positive for headaches.       Objective:    BP (!) 158/95   Pulse 95   Temp 97.8 F (36.6 C) (Tympanic)   Resp 16   Ht 1.727 m (5\' 8" )   Wt 98.9 kg (218 lb)   BMI 33.15 kg/m     Physical Exam  Vitals and nursing note reviewed.   Constitutional:       General: She is not in acute distress.     Appearance: Normal appearance. She is not ill-appearing, toxic-appearing or diaphoretic.   HENT:      Head: Normocephalic and atraumatic.      Right Ear:  Ear canal and external ear normal. There is no impacted cerumen.      Left Ear: Ear canal and external ear normal. There is no impacted cerumen.      Ears:      Comments: Bilateral fluid level present       Nose: Congestion and rhinorrhea present.      Mouth/Throat:      Mouth: Mucous membranes are moist.      Pharynx: Oropharynx is clear. No oropharyngeal exudate or posterior oropharyngeal erythema.   Eyes:      General:         Right eye: No discharge.         Left eye: No discharge.      Extraocular Movements: Extraocular movements intact.      Conjunctiva/sclera: Conjunctivae normal.   Cardiovascular:      Rate and Rhythm: Normal rate and regular rhythm.      Pulses: Normal pulses.      Heart sounds: Normal heart sounds. No murmur heard.     No friction rub. No gallop.   Pulmonary:      Effort: Pulmonary effort is normal. No respiratory distress.      Breath sounds: Normal breath sounds. No stridor. No wheezing, rhonchi or rales.   Chest:      Chest wall: No tenderness.   Musculoskeletal:      Cervical back: Normal range of motion and neck supple. No rigidity or tenderness.    Lymphadenopathy:      Cervical: No cervical adenopathy.   Skin:     General: Skin is warm and dry.      Capillary Refill: Capillary refill takes less than 2 seconds.   Neurological:      Mental Status: She is alert and oriented to person, place, and time.   Psychiatric:         Mood and Affect: Mood normal.         Behavior: Behavior normal.         Thought Content: Thought content normal.         Assessment and Plan:       Hannah Barnes was seen today for sinus problem.    Diagnoses and all orders for this visit:    Subacute pansinusitis      -Reassured patient that she does  not have a sinus infection at this time but that there is sinus inflammation.   -Recommended taking Allegra D to help dry up congestion.   -Hydrate well.   -Return to clinic if sx do not improve or worsen.   -Patient verbalized understanding and agrees with POC.    Aldean Jewett, NP  [facility]  08/04/2021  2:07 PM

## 2021-08-10 ENCOUNTER — Ambulatory Visit: Payer: Medicare Other | Admitting: Family

## 2021-08-10 ENCOUNTER — Encounter: Payer: Self-pay | Admitting: Family

## 2021-08-10 VITALS — BP 144/75 | HR 88 | Temp 98.0°F | Resp 17 | Ht 69.6 in | Wt 214.0 lb

## 2021-08-10 DIAGNOSIS — R0981 Nasal congestion: Secondary | ICD-10-CM

## 2021-08-10 DIAGNOSIS — R030 Elevated blood-pressure reading, without diagnosis of hypertension: Secondary | ICD-10-CM

## 2021-08-10 MED ORDER — DOXYCYCLINE HYCLATE 100 MG PO CAPS
100.0000 mg | ORAL_CAPSULE | Freq: Two times a day (BID) | ORAL | 0 refills | Status: DC
Start: ? — End: 2021-08-10

## 2021-08-10 MED ORDER — FLUTICASONE PROPIONATE 50 MCG/ACT NA SUSP
1.0000 | Freq: Every day | NASAL | 0 refills | Status: DC
Start: ? — End: 2021-08-10

## 2021-08-10 MED ORDER — LORATADINE 10 MG PO TABS
10.0000 mg | ORAL_TABLET | Freq: Every day | ORAL | 0 refills | Status: DC
Start: ? — End: 2021-08-10

## 2021-08-10 NOTE — Progress Notes (Signed)
Subjective:    Patient ID: Hannah SatoDenise Renee Barnes is a 71 y.o. female.    C/o sinus congestion/cold symptoms x 1.5+ weeks.    States she is planning to fly home to [place] soon and is worried about her ears.  Because last night she had severe R ear pain.        The following portions of the patient's history were reviewed and updated as appropriate: allergies, current medications, past family history, past medical history, past social history, past surgical history, and problem list.    Review of Systems   Constitutional:  Negative for chills and fever.   HENT:  Positive for congestion, ear pain (R) and rhinorrhea. Negative for sore throat.    Eyes:  Negative for discharge.   Respiratory:  Negative for cough and shortness of breath.    Cardiovascular:  Negative for chest pain.   Gastrointestinal:  Negative for diarrhea and vomiting.   Genitourinary:  Negative for difficulty urinating.   Musculoskeletal:  Negative for myalgias.   Skin:  Negative for rash.   Neurological:  Negative for headaches.       Allergies   Allergen Reactions    Penicillins Hives    Sulfa Antibiotics Hives       Past Medical History:   Diagnosis Date    Arthritis     Bil knees s/p knee replacements    Headache     Migraines since a child controlled on Topamax    Hiatal hernia     Hypothyroidism     Stable on synthroid dose > 6 months    Morbid obesity with BMI of 45.0-49.9, adult     BMI 49.8    Post-operative nausea and vomiting     Mild-Moderate       Past Surgical History:   Procedure Laterality Date    CHOLECYSTECTOMY  2008    JOINT REPLACEMENT Bilateral 2012    [place]nee replacements done in [place]    KNEE CARTILAGE SURGERY Left > 10 yrs    LAPAROSCOPIC GASTRIC BANDING  2007    LAPAROSCOPIC, GASTRIC BANDING, ADJUSTABLE, REMOVAL N/A 01/23/2014    Procedure: LAPAROSCOPIC, GASTRIC BANDING, ADJUSTABLE, REMOVAL;  Surgeon: Nuala AlphaFitzer, Neal DyMatthew Alexander, MD;  Location: [place] [facility];  Service: General;  Laterality: N/A;  LAPAROSCOPIC LAP BAND REMOVAL       LAPAROSCOPIC, GASTRIC BYPASS N/A 01/23/2014    Procedure: LAPAROSCOPIC, GASTRIC BYPASS;  Surgeon: Nuala AlphaFitzer, Neal DyMatthew Alexander, MD;  Location: [place] [facility];  Service: General;  Laterality: N/A;  LAPAROSCOPIC, GASTRIC BYPASS    LAPAROSCOPIC, LYSIS, ADHESIONS N/A 01/23/2014    Procedure: LAPAROSCOPIC, LYSIS, ADHESIONS;  Surgeon: Nuala AlphaFitzer, Neal DyMatthew Alexander, MD;  Location:  MAIN OR;  Service: General;  Laterality: N/A;       History reviewed. No pertinent family history.        Objective:    BP 144/75   Pulse 88   Temp 98 F (36.7 C)   Resp 17   Ht 1.768 m (5' 9.6")   Wt 97.1 kg (214 lb)   BMI 31.06 kg/m     Physical Exam  Vitals and nursing note reviewed.   Constitutional:       General: She is not in acute distress.     Appearance: She is well-developed. She is not ill-appearing or toxic-appearing.   HENT:      Head: Normocephalic.   Eyes:      General:         Right eye: No discharge.  Left eye: No discharge.      Conjunctiva/sclera:      Right eye: Right conjunctiva is not injected.      Left eye: Left conjunctiva is not injected.   Cardiovascular:      Rate and Rhythm: Normal rate and regular rhythm.      Heart sounds: Normal heart sounds.   Pulmonary:      Effort: Pulmonary effort is normal. No tachypnea, bradypnea or respiratory distress.      Breath sounds: Normal breath sounds. No decreased breath sounds, wheezing, rhonchi or rales.   Skin:     General: Skin is warm and dry.      Findings: No rash.   Neurological:      Mental Status: She is alert and oriented to person, place, and time. She is not disoriented.      GCS: GCS eye subscore is 4. GCS verbal subscore is 5. GCS motor subscore is 6.           Assessment and Plan:       Hannah Barnes was seen today for follow-up.    Diagnoses and all orders for this visit:    Sinus congestion  -     doxycycline (VIBRAMYCIN) 100 MG capsule; Take 1 capsule (100 mg) by mouth 2 (two) times daily for 10 days  -     fluticasone (FLONASE) 50 MCG/ACT nasal  spray; 1-2 sprays by Nasal route daily  -     loratadine (CLARITIN) 10 MG tablet; Take 1 tablet (10 mg) by mouth daily    Elevated blood pressure reading      -Rx as above  -- Avoid the sun w/ doxy.  -Take Tylenol or Ibuprofen every 4-6 hours as needed for pain or fever.  -Drink lots of water, monitor PO intake and urine output for hydration.    -F/u w/ PCP for BP.    Go to the ER for any new or worsening symptoms that concern you.  Follow-up with your primary care doctor or return to Urgent Care if your symptoms do not improve.    Patient agrees with the plan.                Carolin Sicks, NP  [facility]  08/10/2021  11:12 AM

## 2021-11-24 ENCOUNTER — Ambulatory Visit: Payer: Medicare Other | Admitting: Family

## 2021-11-24 VITALS — BP 146/76 | HR 70 | Temp 97.1°F | Resp 17 | Ht 67.0 in | Wt 211.0 lb

## 2021-11-24 DIAGNOSIS — J0140 Acute pansinusitis, unspecified: Secondary | ICD-10-CM

## 2021-11-24 MED ORDER — DOXYCYCLINE MONOHYDRATE 100 MG PO TABS
100.0000 mg | ORAL_TABLET | Freq: Two times a day (BID) | ORAL | 0 refills | Status: DC
Start: ? — End: 2021-11-24

## 2021-11-24 NOTE — Progress Notes (Signed)
Subjective:    Patient ID: Hannah Barnes is a 71 y.o. female.    Chief Complaint   Patient presents with    Sinus Problem     Pt reports she has a sinus infection, pt was given steroid last week, but patient still feels very congested.         Hannah Barnes is a 71 y.o. F here today with c/o sinus congestion for the past 3 weeks.   She was given steroids last week and had a mild improvement in congestion but not much.   Denies fever, cough, SOB, CP, and GI sx.   She is having mild sore throat from PND.  Taking allergy medication but no other medications for sx.           The following portions of the patient's history were reviewed and updated as appropriate: allergies, current medications, past family history, past medical history, past social history, past surgical history, and problem list.    Past Medical History:   Diagnosis Date    Arthritis     Bil knees s/p knee replacements    Headache     Migraines since a child controlled on Topamax    Hiatal hernia     Hypothyroidism     Stable on synthroid dose > 6 months    Morbid obesity with BMI of 45.0-49.9, adult     BMI 49.8    Post-operative nausea and vomiting     Mild-Moderate     Current Outpatient Medications on File Prior to Visit   Medication Sig Dispense Refill    apixaban (Eliquis) 5 MG Take 1 tablet (5 mg) by mouth      doxepin (SINEquan) 10 MG capsule Take 1 capsule (10 mg) by mouth nightly      fluticasone (FLONASE) 50 MCG/ACT nasal spray 1-2 sprays by Nasal route daily 15.8 mL 0    levothyroxine (SYNTHROID, LEVOTHROID) 75 MCG tablet Take 1 tablet (75 mcg) by mouth Once a day at 6:00am      loratadine (CLARITIN) 10 MG tablet Take 1 tablet (10 mg) by mouth daily 30 tablet 0    montelukast (SINGULAIR) 10 MG tablet Take 1 tablet (10 mg) by mouth nightly      Multiple Vitamin (MULTIVITAMIN) tablet Take 1 tablet by mouth daily       No current facility-administered medications on file prior to visit.     Allergies   Allergen Reactions    Ciprofloxacin Hives  and Itching    Doxycycline Nausea And Vomiting    Penicillins Hives    Sulfa Antibiotics Hives    Ibuprofen      Gastric bypass           Review of Systems   Constitutional:  Positive for fatigue. Negative for appetite change and fever.   HENT:  Positive for congestion, ear pain (bilateral ear pressure), postnasal drip, rhinorrhea, sinus pressure, sinus pain and sore throat. Negative for trouble swallowing.    Respiratory:  Negative for cough, chest tightness, shortness of breath and wheezing.    Cardiovascular: Negative.    Gastrointestinal: Negative.    Musculoskeletal: Negative.    Skin: Negative.    Neurological:  Positive for headaches. Negative for dizziness and weakness.       Objective:    BP 146/76   Pulse 70   Temp 97.1 F (36.2 C) (Tympanic)   Resp 17   Ht 1.702 m (5\' 7" )   Wt 95.7 kg (211 lb)  BMI 33.05 kg/m     Physical Exam  Vitals and nursing note reviewed.   Constitutional:       General: She is not in acute distress.     Appearance: Normal appearance. She is ill-appearing. She is not toxic-appearing or diaphoretic.   HENT:      Head: Normocephalic and atraumatic.      Right Ear: Tympanic membrane, ear canal and external ear normal. There is no impacted cerumen.      Left Ear: Tympanic membrane, ear canal and external ear normal. There is no impacted cerumen.      Nose: Congestion and rhinorrhea present.      Mouth/Throat:      Mouth: Mucous membranes are moist.      Pharynx: Oropharynx is clear. Posterior oropharyngeal erythema present. No oropharyngeal exudate.   Eyes:      General:         Right eye: No discharge.         Left eye: No discharge.      Extraocular Movements: Extraocular movements intact.      Conjunctiva/sclera: Conjunctivae normal.   Cardiovascular:      Rate and Rhythm: Normal rate and regular rhythm.      Pulses: Normal pulses.      Heart sounds: Normal heart sounds. No murmur heard.     No friction rub. No gallop.   Pulmonary:      Effort: Pulmonary effort is normal. No  respiratory distress.      Breath sounds: Normal breath sounds. No stridor. No wheezing, rhonchi or rales.   Chest:      Chest wall: No tenderness.   Musculoskeletal:      Cervical back: Normal range of motion and neck supple. No rigidity or tenderness.   Lymphadenopathy:      Cervical: No cervical adenopathy.   Skin:     General: Skin is warm and dry.      Capillary Refill: Capillary refill takes less than 2 seconds.   Neurological:      Mental Status: She is alert and oriented to person, place, and time.   Psychiatric:         Mood and Affect: Mood normal.         Behavior: Behavior normal.         Thought Content: Thought content normal.         Assessment and Plan:       Hannah Barnes was seen today for sinus problem.    Diagnoses and all orders for this visit:    Acute non-recurrent pansinusitis  -     doxycycline (ADOXA) 100 MG tablet; Take 1 tablet (100 mg) by mouth 2 (two) times daily for 7 days      - Because of the length and severity of symptoms, it is appropriate to treat with an abx at this time.  - Advised patient to take medication with food because of potential for GI upset.   - Discussed potential side effects of medication including c diff and yeast infections. This can be decreased by taking a probiotic or eating yogurt while taking this medication.  - Supportive care discussed.  - Advised patient to RTC or f/u with PCP if symptoms worsen or persist.  - Patient verbalizes understanding and agrees with plan of care listed above.         Hannah Jewett, NP  [facility]  11/24/2021  10:15 AM

## 2021-12-12 ENCOUNTER — Ambulatory Visit: Payer: Medicare Other | Admitting: Family

## 2021-12-12 ENCOUNTER — Ambulatory Visit: Payer: Medicare Other | Attending: Family

## 2021-12-12 VITALS — BP 146/86 | HR 83 | Temp 98.2°F | Ht 67.5 in | Wt 212.0 lb

## 2021-12-12 DIAGNOSIS — M25552 Pain in left hip: Secondary | ICD-10-CM

## 2021-12-12 DIAGNOSIS — Z96642 Presence of left artificial hip joint: Secondary | ICD-10-CM

## 2021-12-12 DIAGNOSIS — M7062 Trochanteric bursitis, left hip: Secondary | ICD-10-CM

## 2021-12-12 DIAGNOSIS — J324 Chronic pansinusitis: Secondary | ICD-10-CM

## 2021-12-12 MED ORDER — DICLOFENAC SODIUM 1 % EX GEL
4.0000 g | Freq: Four times a day (QID) | CUTANEOUS | 0 refills | Status: DC
Start: ? — End: 2021-12-12

## 2021-12-12 NOTE — Progress Notes (Signed)
Subjective:    Patient ID: Hannah Barnes is a 71 y.o. female.    Chief Complaint   Patient presents with    Hip Pain     Patient reports she had a left hip replacement in June 2023 by a doctor in [place]. Patient states she has been having left hip pain on the lateral aspect. Patient states the hip is tender to touch with a numbness sensation going to mid thigh. No known injury.        Hannah Barnes 71 y.o. F here today with c/o left lateral hip pain that radiates to mid thigh for the past few days.   Had left hip replacement surgery in June 2023 and has completed post-op physical therapy.   She has been taking Tylenol with no improvement in pain.   Has had gastric bypass surgery in the past so she is unable to take NSAIDs.  Called her orthopedic surgeon in [place] (who did the surgery) and they recommended that she be evaluated in [facility].   Denies any clicking or clunking feeling within the joint.   No discoloration but has had some edema.   Denies fall or other traumatic injury.     She is also experiencing sinus congestion and pressure.   This has been ongoing for the past month and a half.   She had doxycycline and finished the course at the end of August and reports she did not have any improvement.   Eating and drinking normally.   Denies fever, CP, SOB, or GI sx.           The following portions of the patient's history were reviewed and updated as appropriate: allergies, current medications, past family history, past medical history, past social history, past surgical history, and problem list.    Past Medical History:   Diagnosis Date    Arthritis     Bil knees s/p knee replacements    Headache     Migraines since a child controlled on Topamax    Hiatal hernia     Hypothyroidism     Stable on synthroid dose > 6 months    Morbid obesity with BMI of 45.0-49.9, adult     BMI 49.8    Post-operative nausea and vomiting     Mild-Moderate     Current Outpatient Medications on File Prior to Visit   Medication Sig  Dispense Refill    apixaban (Eliquis) 5 MG Take 1 tablet (5 mg) by mouth      doxepin (SINEquan) 10 MG capsule Take 1 capsule (10 mg) by mouth nightly      levothyroxine (SYNTHROID, LEVOTHROID) 75 MCG tablet Take 1 tablet (75 mcg) by mouth Once a day at 6:00am      loratadine (CLARITIN) 10 MG tablet Take 1 tablet (10 mg) by mouth daily 30 tablet 0    Multiple Vitamin (MULTIVITAMIN) tablet Take 1 tablet by mouth daily      fluticasone (FLONASE) 50 MCG/ACT nasal spray 1-2 sprays by Nasal route daily (Patient not taking: Reported on 12/12/2021) 15.8 mL 0    montelukast (SINGULAIR) 10 MG tablet Take 1 tablet (10 mg) by mouth nightly (Patient not taking: Reported on 12/12/2021)       No current facility-administered medications on file prior to visit.     Allergies   Allergen Reactions    Ciprofloxacin Hives and Itching    Doxycycline Nausea And Vomiting    Penicillins Hives    Sulfa Antibiotics Hives  Ibuprofen      Gastric bypass           Review of Systems   Constitutional:  Negative for appetite change, fatigue and fever.   HENT:  Positive for congestion, postnasal drip, rhinorrhea, sinus pressure and sinus pain. Negative for ear pain, sore throat and trouble swallowing.    Respiratory:  Negative for cough, chest tightness, shortness of breath and wheezing.    Cardiovascular:  Negative for chest pain.   Gastrointestinal:  Negative for abdominal pain, constipation, diarrhea, nausea and vomiting.   Musculoskeletal:  Positive for arthralgias (lateral left hip pain), gait problem (limping) and joint swelling (left hip).   Skin: Negative.    Neurological:  Negative for dizziness, weakness and headaches.       Objective:    BP 146/86   Pulse 83   Temp 98.2 F (36.8 C) (Tympanic)   Ht 1.715 m (5' 7.5")   Wt 96.2 kg (212 lb)   BMI 32.71 kg/m     Physical Exam  Vitals and nursing note reviewed.   Constitutional:       General: She is not in acute distress.     Appearance: Normal appearance. She is normal weight. She is  ill-appearing. She is not toxic-appearing or diaphoretic.   HENT:      Head: Normocephalic and atraumatic.      Right Ear: External ear normal.      Left Ear: External ear normal.      Nose: Congestion present. No rhinorrhea.      Mouth/Throat:      Mouth: Mucous membranes are moist.      Pharynx: Oropharynx is clear. No oropharyngeal exudate or posterior oropharyngeal erythema.   Eyes:      General:         Right eye: No discharge.         Left eye: No discharge.      Extraocular Movements: Extraocular movements intact.      Conjunctiva/sclera: Conjunctivae normal.   Cardiovascular:      Rate and Rhythm: Normal rate and regular rhythm.      Pulses: Normal pulses.      Heart sounds: Normal heart sounds. No murmur heard.     No friction rub. No gallop.   Pulmonary:      Effort: Pulmonary effort is normal. No respiratory distress.      Breath sounds: Normal breath sounds. No stridor. No wheezing, rhonchi or rales.   Chest:      Chest wall: No tenderness.   Musculoskeletal:         General: Swelling (left lateral hip, mild) and tenderness (tenderness to palpation over left trochanteric head) present. Normal range of motion.      Cervical back: Normal range of motion and neck supple. No rigidity or tenderness.      Right lower leg: No edema.      Left lower leg: Edema (mild) present.   Skin:     General: Skin is warm and dry.      Capillary Refill: Capillary refill takes less than 2 seconds.   Neurological:      Mental Status: She is alert and oriented to person, place, and time.   Psychiatric:         Mood and Affect: Mood normal.         Behavior: Behavior normal.         Thought Content: Thought content normal.  XR Hip Left 2-3 vw without pelvis    Result Date: 12/12/2021  No acute abnormality. ReadingStation:[id number]      Interpreted by radiologist and independently reviewed by me.    Assessment and Plan:       Hannah Barnes was seen today for hip pain.    Diagnoses and all orders for this visit:    Lateral pain of  left hip  -     XR Hip Left 2-3 vw without pelvis; Future    Trochanteric bursitis of left hip  -     diclofenac Sodium (VOLTAREN) 1 % Gel topical gel; Apply 4 g topically 4 (four) times daily for 14 days    History of left hip replacement    Chronic pansinusitis      -Independent review pf left hip XR does not indicate movement of the hip replacement hardware. Radiology report reviewed.   -Pain likely related to trochanteric bursitis of the left hip.   -Discussed using ice and resting the hip to treat at home. Since she is unable to take oral NSAIDs, diclofenac gel ordered to be applied QID for pain and inflammation.   -Follow up if no improvement in sx.   -Reassured patient that she does not have a bacterial sinus infection but likely chronic sinusitis. Recommended using Allegra D or similar medication to help with allergy symptoms. Patient typically does not have elevated blood pressure.   -Follow up if sx worsen or do not improve.   -Patient verbalized understanding and agrees with POC.      Aldean JewettAmanda Rachael Kahlia Lagunes, NP  [facility]  12/12/2021  11:55 AM

## 2022-12-10 IMAGING — MR MRI LUMBAR SPINE WITHOUT CONTRAST
4 series · 48 of 48 positions shown · IV contrast (Off)
Comparison: none

MRI OF THE LUMBAR SPINE WITHOUT CONTRAST
CLINICAL HISTORY: Low back pain, left radiculopathy.
TECHNIQUE: Multisequential multiplanar imaging was performed of the lumbar spinal region.

[Series 1: z s-c scano · coronal · 6.0mm · 1.17mm/px · 4 of 6 slices shown]
[im 1/6]
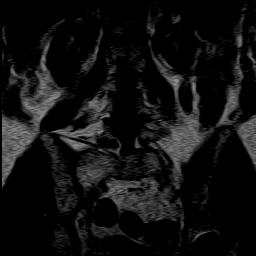
[im 2/6]
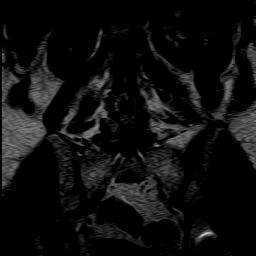
[im 4/6]
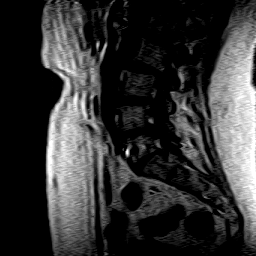
[im 6/6]
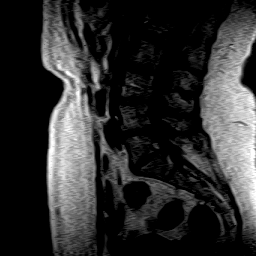

[Series 2: T2 · sagittal · 5.0mm · 1.13mm/px · 11 of 13 slices shown (1 of 2)]
[im 1/13]
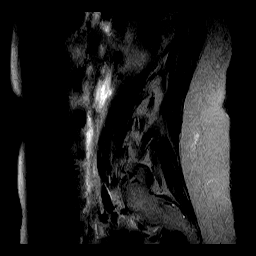
[im 2/13]
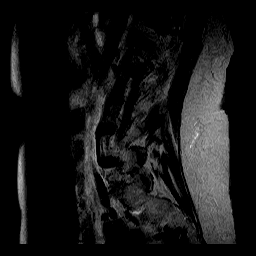
[im 3/13]
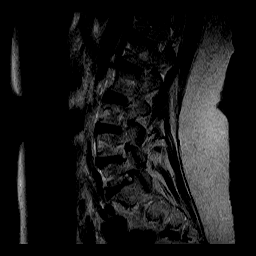
[im 4/13]
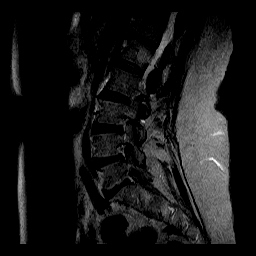
[im 5/13]
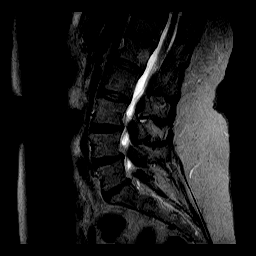
[im 7/13]
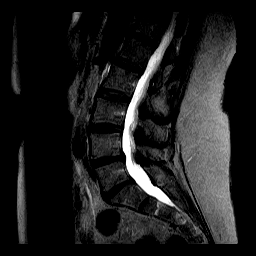
[im 8/13]
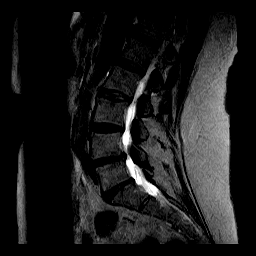
[im 9/13]
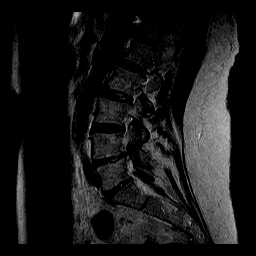
[im 10/13]
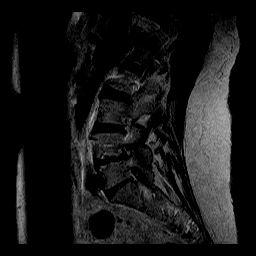
[im 11/13]
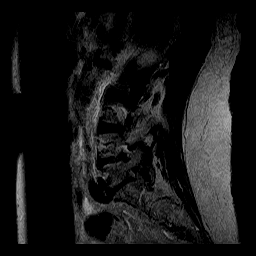
[im 13/13]
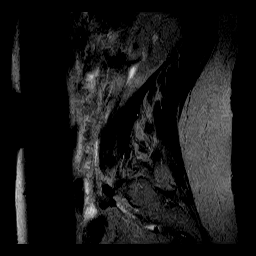

[Series 3: T1 · sagittal · 5.0mm · 1.13mm/px · 11 of 13 slices shown]
[im 1/13]
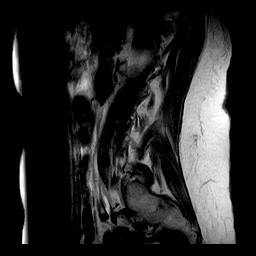
[im 2/13]
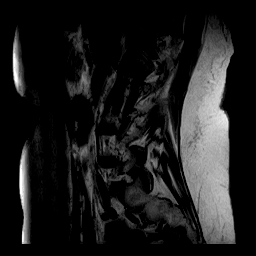
[im 3/13]
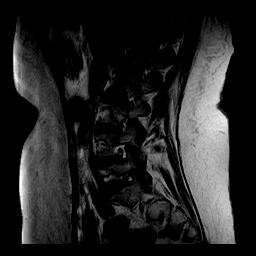
[im 4/13]
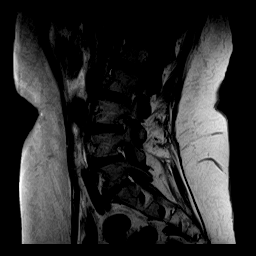
[im 5/13]
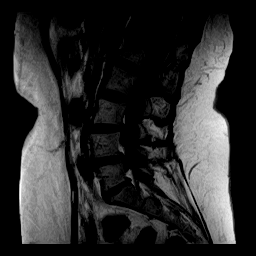
[im 7/13]
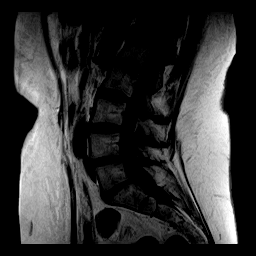
[im 8/13]
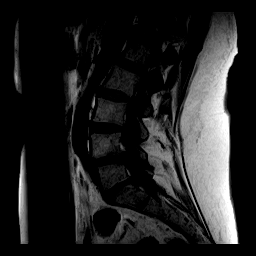
[im 9/13]
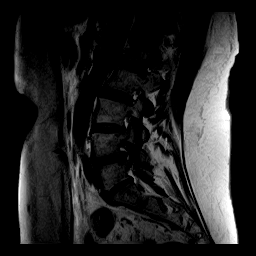
[im 10/13]
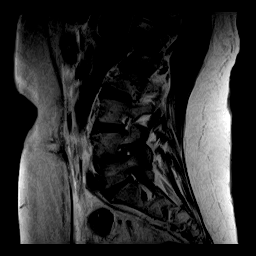
[im 11/13]
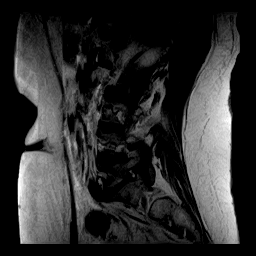
[im 13/13]
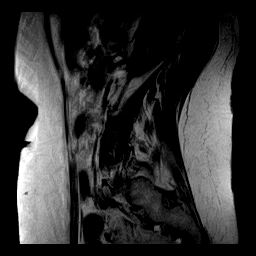

[Series 5: T2 · axial · 4.0mm · 1.17mm/px · z∈[-89,+138]mm · 22 of 27 slices shown (2 of 2)]
[im 1/27]
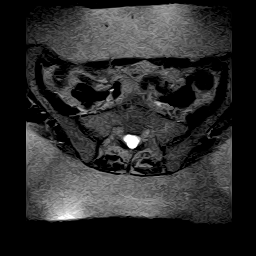
[im 2/27]
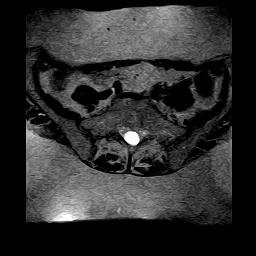
[im 3/27]
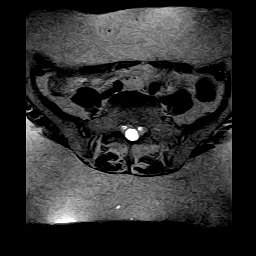
[im 4/27]
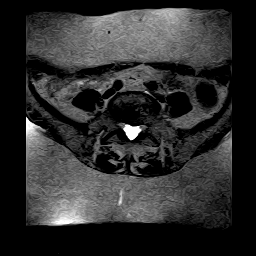
[im 5/27]
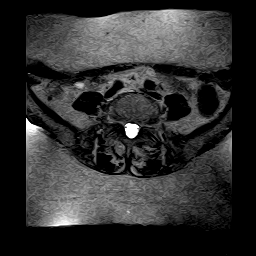
[im 7/27]
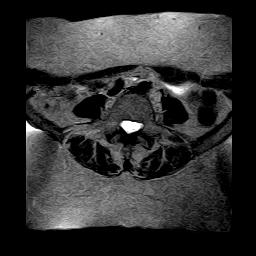
[im 8/27]
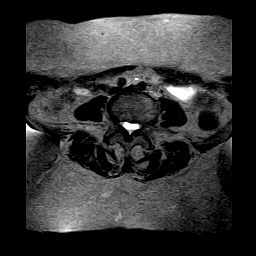
[im 9/27]
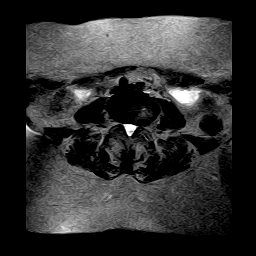
[im 10/27]
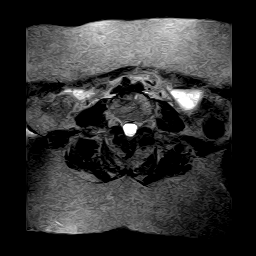
[im 12/27]
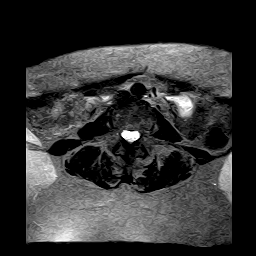
[im 13/27]
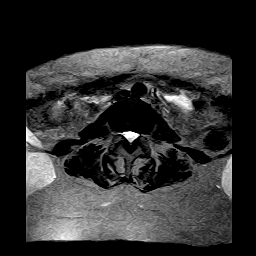
[im 14/27]
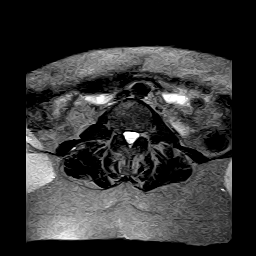
[im 15/27]
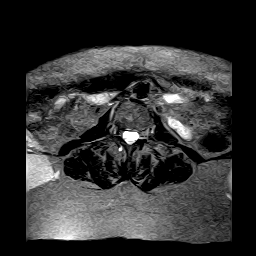
[im 17/27]
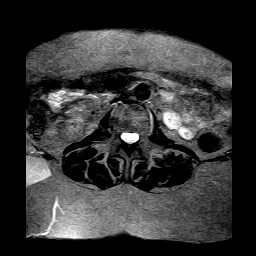
[im 18/27]
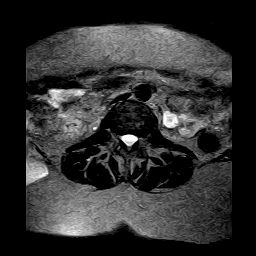
[im 19/27]
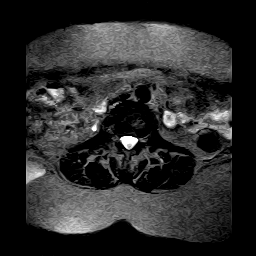
[im 20/27]
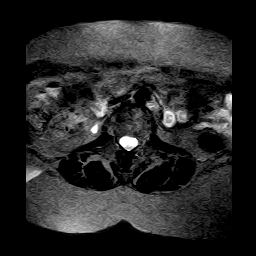
[im 22/27]
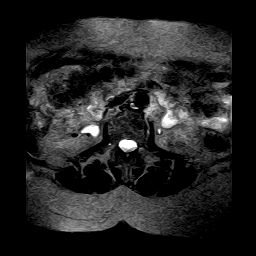
[im 23/27]
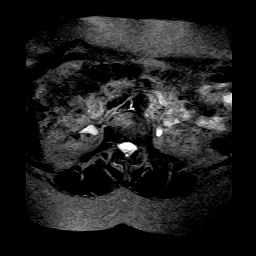
[im 24/27]
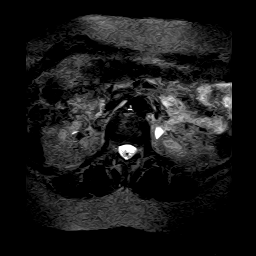
[im 25/27]
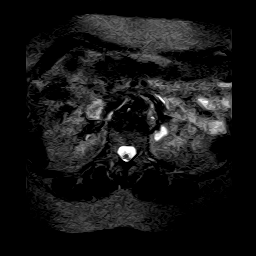
[im 27/27]
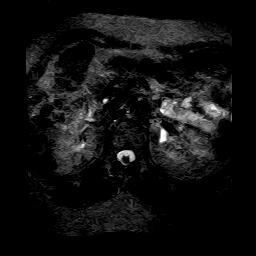

[48 of 48 positions shown; findings below may reference images not displayed]

FINDINGS: There is a normal marrow signal noted throughout the lumbar vertebral bodies. The conus medullaris is unremarkable and there is no obvious intradural abnormality noted. There is moderate facet arthrosis and ligamentum flavum hypertrophy throughout the lumbar spinal region. 

At T12-L1 level, there is narrowing and anterior, posterior, and lateral osteophyte. 

At L1-L2 level, there is posterior and lateral osteophyte and 1 to 1.5 mm retrolisthesis. 

At L2-L3 level, there is no evidence of disc herniation, neural foraminal narrowing, or spinal stenosis. 

At L3-L4 level, there is no evidence of disc herniation, neural foraminal narrowing, or spinal stenosis. 

At L4-L5 level, there is anterior, posterior, and lateral osteophyte, 1.5 to 2 mm anterolisthesis, and 2 mm broad-based bulging. Moderate bilateral neural foraminal narrowing. 

At L5-S1 level, there is narrowing, anterior, posterior, and lateral osteophyte, 1.5 mm bulging, and moderate bilateral neural foraminal narrowing.
IMPRESSION: 1.
Narrowing and anterior, posterior, and lateral osteophyte at T12-L1 and posterior and lateral osteophyte at L1-L2. Anterior, posterior, and lateral osteophyte and 2 mm broad-based bulging at L4-L5. Narrowing, anterior, posterior, and lateral osteophyte, and 1 to 1.5 mm bulging at L5-S1. There is moderate facet arthrosis but no spinal stenosis. 

2.
Lateral osteophyte and facet arthrosis are causing moderate bilateral neural foraminal narrowing at L4-L5 and L5-S1. 

3.
1 to 1.5 mm retrolisthesis of L1-L2 and 1.5 to 2 mm anterolisthesis of L4-L5 in neutral position.
